# Patient Record
Sex: Female | Born: 1955 | Race: White | Marital: Married | Smoking: Former smoker
Health system: Southern US, Community
[De-identification: ages and names within clinical notes are randomized; demographics above are authoritative.]

## PROBLEM LIST (undated history)

## (undated) DIAGNOSIS — Z87820 Personal history of traumatic brain injury: Secondary | ICD-10-CM

## (undated) DIAGNOSIS — I10 Essential (primary) hypertension: Secondary | ICD-10-CM

## (undated) DIAGNOSIS — Z780 Asymptomatic menopausal state: Secondary | ICD-10-CM

## (undated) DIAGNOSIS — N8110 Cystocele, unspecified: Secondary | ICD-10-CM

## (undated) DIAGNOSIS — E785 Hyperlipidemia, unspecified: Secondary | ICD-10-CM

## (undated) DIAGNOSIS — IMO0001 Reserved for inherently not codable concepts without codable children: Secondary | ICD-10-CM

## (undated) DIAGNOSIS — K219 Gastro-esophageal reflux disease without esophagitis: Secondary | ICD-10-CM

## (undated) DIAGNOSIS — R569 Unspecified convulsions: Secondary | ICD-10-CM

## (undated) HISTORY — PX: DILATION AND CURETTAGE OF UTERUS: SHX78

## (undated) HISTORY — DX: Hyperlipidemia, unspecified: E78.5

## (undated) HISTORY — DX: Asymptomatic menopausal state: Z78.0

## (undated) HISTORY — PX: TONSILLECTOMY: SUR1361

## (undated) HISTORY — DX: Essential (primary) hypertension: I10

## (undated) HISTORY — DX: Reserved for inherently not codable concepts without codable children: IMO0001

## (undated) HISTORY — DX: Personal history of traumatic brain injury: Z87.820

---

## 1978-06-08 HISTORY — PX: TRACHEOSTOMY CLOSURE: SHX6132

## 2004-12-03 ENCOUNTER — Ambulatory Visit: Payer: Self-pay

## 2005-12-29 ENCOUNTER — Ambulatory Visit: Payer: Self-pay

## 2008-02-01 ENCOUNTER — Ambulatory Visit: Payer: Self-pay

## 2008-08-30 ENCOUNTER — Encounter: Payer: Self-pay | Admitting: Cardiovascular Disease

## 2008-09-09 LAB — HM COLONOSCOPY

## 2008-09-19 ENCOUNTER — Ambulatory Visit: Payer: Self-pay | Admitting: Gastroenterology

## 2009-05-15 ENCOUNTER — Ambulatory Visit: Payer: Self-pay | Admitting: Family Medicine

## 2009-08-27 ENCOUNTER — Ambulatory Visit: Payer: Self-pay | Admitting: Cardiovascular Disease

## 2009-08-27 DIAGNOSIS — R079 Chest pain, unspecified: Secondary | ICD-10-CM

## 2009-08-27 DIAGNOSIS — I10 Essential (primary) hypertension: Secondary | ICD-10-CM | POA: Insufficient documentation

## 2009-10-17 ENCOUNTER — Ambulatory Visit: Payer: Self-pay | Admitting: Family Medicine

## 2009-12-10 ENCOUNTER — Ambulatory Visit: Payer: Self-pay | Admitting: Family Medicine

## 2010-05-20 ENCOUNTER — Ambulatory Visit: Payer: Self-pay

## 2010-07-08 NOTE — Assessment & Plan Note (Signed)
Summary: NP6/AMD   Visit Type:  New Patient Referring Provider:  Sowles Primary Provider:  Dr Carlynn Purl  CC:  bp elevated, some chest discomfort, no sob, and no edema.  History of Present Illness: [name] is a 55 year old woman referred by Dr.Sowles, with a long history of chest pain over the past several years, with stress test last year per her report as well as an additional stress test several years ago that were reportedly normal, presents for evaluation of recent chest discomfort.  She states that over the past several years, she has had chest pain typically located on the left side sometimes radiating through to her left scapular region. She states that it lasts typically less than 5 minutes him a comes on sometimes at rest and sometimes with exertion. She is unable to reproduce it, states he does not get worse with exercise. She leads a busy lifestyle and works at [employer] and takes care of 3 teenagers. Her pain has not been getting progressively worse and has been stable over the past several years.  She denies any lightheadedness, dizziness, diaphoresis, edema, GERD symptoms. She does report she pushes on her discomfort with fingers it seems to make the pain go away both in her back and in her left chest region.  she was recently started on lisinopril HCT for hypertension in addition to clonidine. She denies any lightheadedness and dizziness. Today was the first day of the medication.  EKG from March 21 shows normal sinus rhythm with rate 58 beats per minute, no significant ST or T wave changes noted.  Preventive Screening-Counseling & Management  Alcohol-Tobacco     Alcohol drinks/day: 0     Smoking Status: quit     Year Quit: 1986  Caffeine-Diet-Exercise     Caffeine use/day: 2-3     Does Patient Exercise: yes  Current Problems (verified): 1)  Hypertension, Benign  (ICD-401.1) 2)  Chest Pain-unspecified  (ICD-786.50)  Current Medications (verified): 1)  Daily Multi   Tabs (Multiple Vitamins-Minerals) .Marland Kitchen.. 1 By Mouth Once Daily 2)  Clonidine Hcl 0.1 Mg Tabs (Clonidine Hcl) .... Take One Tablet By Mouth Once Daily 3)  Lisinopril-Hydrochlorothiazide 20-12.5 Mg Tabs (Lisinopril-Hydrochlorothiazide) .Marland Kitchen.. 1 By Mouth Once Daily  Allergies (verified): No Known Drug Allergies  Past History:  Past Medical History: Last updated: 08/27/2009 perimenopause HTN chest pains stress test inthe 2000's normal with palps  Past Surgical History: Last updated: 08/27/2009 tonsillecgtomy d&c after one of her deliveries  Family History: Last updated: 08/27/2009 Father: living: DM and gallstones Mother: living 43's: arrhythmia Sister: DM  Social History: Last updated: 08/27/2009 Full Time Married  Tobacco Use - Former.  Alcohol Use - no Regular Exercise - yes Drug Use - no  Risk Factors: Alcohol Use: 0 (08/27/2009) Caffeine Use: 2-3 (08/27/2009) Exercise: yes (08/27/2009)  Risk Factors: Smoking Status: quit (08/27/2009)  Social History: Alcohol drinks/day:  0 Caffeine use/day:  2-3  Review of Systems       The patient complains of chest pain.  The patient denies fever, vision loss, decreased hearing, hoarseness, syncope, dyspnea on exertion, peripheral edema, prolonged cough, abdominal pain, incontinence, muscle weakness, depression, and enlarged lymph nodes.    Vital Signs:  Patient profile:   55 year old female Height:      67 inches Weight:      158.50 pounds BMI:     24.91 Pulse rate:   76 / minute Pulse rhythm:   regular BP sitting:   100 / 67  (left arm)  Cuff size:   regular  Vitals Entered By: Mercer Pod (August 27, 2009 3:05 PM)  Physical Exam  General:  well-appearing middle-aged woman in no apparent distress, HEENT exam is benign, oropharynx is clear, neck is supple with no JVP or carotid bruits, heart sounds are regular with S1-S2 and no murmurs appreciated, lungs are clear to auscultation with no wheezes Rales, normal  exam is benign, no significant lower extremity edema, neurologic exam is nonfocal, skin is warm and dry.    Impression & Recommendations:  Problem # 1:  CHEST PAIN-UNSPECIFIED (ICD-786.50) etiology of her chest pain is uncertain though given the recent negative stress test at [facility], given the presentation of her symptoms which has been chronic over several years, not associated typically with exertion, very atypical in presentation, I do not think this is cardiac.  If her pains started to get worse with more typical symptoms, we could perform a repeat stress test. She is not interested in that at this time. She would not qualify for cardiac catheterization.  Medications such as nitroglycerin would not help her as her symptoms typically resolve after several minutes.  She certainly could have other causes of pain  such as coronary spasm or esophageal spasm, hiatal hernia. One option would be to change one of her blood pressure medications, such as her clonidine and/or  lisinopril to a calcium channel blocker such as verapamil or diltiazem.  I have asked her to try an exercise program to ensure that this is not muscular ligamental in etiology. There seemed to be some sensitivity with palpation both in her back and in the left chest region.  We will try to obtain her most recent cholesterol panel for our records. There is a family history of coronary artery disease with her mother having bypass surgery. Have asked her to continue the aspirin and once we have a cholesterol, we can make further recommendation.  Her updated medication list for this problem includes:    Lisinopril-hydrochlorothiazide 20-12.5 Mg Tabs (Lisinopril-hydrochlorothiazide) .Marland Kitchen... 1 by mouth once daily  Problem # 2:  HYPERTENSION, BENIGN (ICD-401.1) she started lisinopril today and blood pressure was borderline low. I've asked her to purchase a blood pressure cuff and check her pressures at home to  ensure that she is within a good range. The asked her to contact Dr. Carlynn Purl if her medications need adjusting. We would be happy to help if needed.  Her updated medication list for this problem includes:    Clonidine Hcl 0.1 Mg Tabs (Clonidine hcl) .Marland Kitchen... Take one tablet by mouth once daily    Lisinopril-hydrochlorothiazide 20-12.5 Mg Tabs (Lisinopril-hydrochlorothiazide) .Marland Kitchen... 1 by mouth once daily

## 2010-07-08 NOTE — Progress Notes (Signed)
Summary: PHI  PHI   Imported By: Harlon Flor 08/28/2009 10:33:06  _____________________________________________________________________  External Attachment:    Type:   Image     Comment:   External Document

## 2010-12-23 ENCOUNTER — Encounter: Payer: Self-pay | Admitting: Cardiovascular Disease

## 2011-07-15 ENCOUNTER — Ambulatory Visit: Payer: Self-pay

## 2012-04-17 ENCOUNTER — Emergency Department: Payer: Self-pay | Admitting: Emergency Medicine

## 2012-08-02 ENCOUNTER — Ambulatory Visit: Payer: Self-pay

## 2013-08-07 ENCOUNTER — Ambulatory Visit: Payer: Self-pay

## 2013-08-16 ENCOUNTER — Ambulatory Visit: Payer: Self-pay | Admitting: Unknown Physician Specialty

## 2017-11-08 ENCOUNTER — Encounter: Payer: Self-pay | Admitting: Urology

## 2017-11-08 ENCOUNTER — Ambulatory Visit: Payer: Self-pay | Admitting: Urology

## 2017-11-08 VITALS — BP 160/82 | HR 56 | Ht 67.0 in | Wt 156.3 lb

## 2017-11-08 DIAGNOSIS — N939 Abnormal uterine and vaginal bleeding, unspecified: Secondary | ICD-10-CM

## 2017-11-08 DIAGNOSIS — R109 Unspecified abdominal pain: Secondary | ICD-10-CM

## 2017-11-08 DIAGNOSIS — N3020 Other chronic cystitis without hematuria: Secondary | ICD-10-CM

## 2017-11-08 LAB — MICROSCOPIC EXAMINATION
RBC, UA: NONE SEEN /hpf (ref 0–2)
WBC, UA: NONE SEEN /hpf (ref 0–5)

## 2017-11-08 LAB — URINALYSIS, COMPLETE
BILIRUBIN UA: NEGATIVE
GLUCOSE, UA: NEGATIVE
Ketones, UA: NEGATIVE
Nitrite, UA: NEGATIVE
PROTEIN UA: NEGATIVE
RBC UA: NEGATIVE
Specific Gravity, UA: 1.01 (ref 1.005–1.030)
UUROB: 0.2 mg/dL (ref 0.2–1.0)
pH, UA: 6.5 (ref 5.0–7.5)

## 2017-11-08 MED ORDER — ME/NAPHOS/MB/HYO1 81.6 MG PO TABS: 1.0000 | ORAL_TABLET | Freq: Two times a day (BID) | ORAL | 3 refills | Status: DC | PRN

## 2017-11-08 NOTE — Progress Notes (Signed)
11/08/2017 4:08 PM   Wallace Keller 1955-12-26 [id number]  Referring provider: No referring provider defined for this encounter.  Chief Complaint  Patient presents with  . New Patient (Initial Visit)    HPI: For many months the patient has had suprapubic soreness.  Is not quite daily.  It comes and goes.  Sometimes it might be relieved by voiding.  She normally voids every 2 hours and now gets up once and sometimes twice at night.  She wears a liner for confidence and rarely leaks  She has had a hysterectomy.  Bowel movements normal.  No neurologic issues.  No previous GU surgery.  She has not had a kidney stone  Modifying factors: There are no other modifying factors  Associated signs and symptoms: There are no other associated signs and symptoms Aggravating and relieving factors: There are no other aggravating or relieving factors Severity: Moderate Duration: Persistent   PMH: Past Medical History:  Diagnosis Date  . Chest pain   . HTN (hypertension)   . Normal cardiac stress test 2000s   normal with palps  . Perimenopause     Surgical History: Past Surgical History:  Procedure Laterality Date  . DILATION AND CURETTAGE OF UTERUS     after one of her deliveries   . TONSILLECTOMY      Home Medications:  Allergies as of 11/08/2017      Reactions   Dilantin [phenytoin Sodium Extended] Rash      Medication List        Accurate as of 11/08/17  4:08 PM. Always use your most recent med list.          aspirin 325 MG EC tablet Take 325 mg by mouth daily.       Allergies:  Allergies  Allergen Reactions  . Dilantin [Phenytoin Sodium Extended] Rash    Family History: Family History  Problem Relation Age of Onset  . Arrhythmia Mother   . Diabetes Father   . Cholelithiasis Father   . Diabetes Sister   . Bladder Cancer Neg Hx   . Kidney cancer Neg Hx     Social History:  reports that she has quit smoking. She has never used smokeless tobacco. She  reports that she does not drink alcohol or use drugs.  ROS: UROLOGY Frequent Urination?: Yes Hard to postpone urination?: Yes Burning/pain with urination?: No Get up at night to urinate?: Yes Leakage of urine?: No Urine stream starts and stops?: No Trouble starting stream?: No Do you have to strain to urinate?: No Blood in urine?: No Urinary tract infection?: No Sexually transmitted disease?: No Injury to kidneys or bladder?: No Painful intercourse?: No Weak stream?: Yes Currently pregnant?: No Vaginal bleeding?: No Last menstrual period?: n  Gastrointestinal Nausea?: No Vomiting?: No Indigestion/heartburn?: No Diarrhea?: No Constipation?: No  Constitutional Fever: No Night sweats?: No Weight loss?: No Fatigue?: No  Skin Skin rash/lesions?: No Itching?: No  Eyes Blurred vision?: No Double vision?: No  Ears/Nose/Throat Sore throat?: No Sinus problems?: No  Hematologic/Lymphatic Swollen glands?: No Easy bruising?: No  Cardiovascular Leg swelling?: No Chest pain?: No  Respiratory Cough?: No Shortness of breath?: No  Endocrine Excessive thirst?: No  Musculoskeletal Back pain?: No Joint pain?: No  Neurological Headaches?: No Dizziness?: No  Psychologic Depression?: No Anxiety?: No  Physical Exam: BP (!) 160/82 (BP Location: Right Arm, Patient Position: Sitting, Cuff Size: Normal)   Pulse (!) 56   Ht 5\' 7"  (1.702 m)   Wt 70.9 kg (  156 lb 4.8 oz)   BMI 24.48 kg/m   Constitutional:  Alert and oriented, No acute distress. HEENT: [place] AT, moist mucus membranes.  Trachea midline, no masses. Cardiovascular: No clubbing, cyanosis, or edema. Respiratory: Normal respiratory effort, no increased work of breathing. GI: Abdomen is soft, nontender, nondistended, no abdominal masses GU: On pelvic examination the patient had a grade 3 cystocele that reached to the introitus.  She had a moderate central defect.  Her cervix descended from 8 or 9 cm to  approximately 5 or 6 cm.  She had a little to no rectocele.  She may have leak a few drops of the cystocele reduced with coughing with hypermobility of the bladder neck.  Tissues were well estrogenized.  I noted a little bit of blood on the tip of the speculum.  Her mid abdomen was minimally tender Skin: No rashes, bruises or suspicious lesions. Lymph: No cervical or inguinal adenopathy. Neurologic: Grossly intact, no focal deficits, moving all 4 extremities. Psychiatric: Normal mood and affect.  Laboratory Data:  Urinalysis No results found for: COLORURINE, APPEARANCEUR, LABSPEC, PHURINE, GLUCOSEU, HGBUR, BILIRUBINUR, KETONESUR, PROTEINUR, UROBILINOGEN, NITRITE, LEUKOCYTESUR  Pertinent Imaging: None  Assessment & Plan: The patient has vague suprapubic pain.  The role of a CT scan and cystoscopy discussed.  At this stage I do not think she has interstitial cystitis.  Her pain seems to be more mid abdomen and she said it was associated with some nausea today.  At first she did not report prolapse symptoms but then reported with standing she feels a little bulging or pressure in the vagina.  She understands a source of the blood and her pain may be her uterus and she still has her ovaries.  I would like to order the above tests and refer her to gynecology.  I do not believe the prolapse is a cause of her symptoms and I am a little bit concerned about the central nature of her pain associated with nausea.  She had some vaginal discomfort after examination in my opinion out of the ordinary but she does not report a lot of vaginal dryness.  I will be extra careful with cystoscopy next visit  There are no diagnoses linked to this encounter.  No follow-ups on file.  Martina SinnerMACDIARMID,Ajiah Mcglinn A, MD  [facility] [address], [address] [place], [place] [address] [phone]

## 2017-11-11 LAB — CULTURE, URINE COMPREHENSIVE

## 2017-11-29 ENCOUNTER — Ambulatory Visit
Admission: RE | Admit: 2017-11-29 | Discharge: 2017-11-29 | Disposition: A | Discharge status: Home / Self Care | Payer: BLUE CROSS/BLUE SHIELD | Source: Ambulatory Visit | Attending: Urology | Admitting: Urology

## 2017-11-29 DIAGNOSIS — N8110 Cystocele, unspecified: Secondary | ICD-10-CM | POA: Insufficient documentation

## 2017-11-29 DIAGNOSIS — N8189 Other female genital prolapse: Secondary | ICD-10-CM | POA: Insufficient documentation

## 2017-11-29 DIAGNOSIS — N939 Abnormal uterine and vaginal bleeding, unspecified: Secondary | ICD-10-CM | POA: Diagnosis present

## 2017-11-29 LAB — POCT I-STAT CREATININE: Creatinine, Ser: 0.7 mg/dL (ref 0.44–1.00)

## 2017-11-29 MED ORDER — IOPAMIDOL (ISOVUE-300) INJECTION 61%
100.0000 mL | Freq: Once | INTRAVENOUS | Status: AC | PRN
  Administered 2017-11-29: 100 mL via INTRAVENOUS

## 2017-12-06 ENCOUNTER — Ambulatory Visit (INDEPENDENT_AMBULATORY_CARE_PROVIDER_SITE_OTHER): Payer: BLUE CROSS/BLUE SHIELD | Admitting: Urology

## 2017-12-06 ENCOUNTER — Encounter: Payer: Self-pay | Admitting: Urology

## 2017-12-06 VITALS — BP 154/85 | HR 60 | Wt 157.8 lb

## 2017-12-06 DIAGNOSIS — N3020 Other chronic cystitis without hematuria: Secondary | ICD-10-CM | POA: Diagnosis not present

## 2017-12-06 LAB — URINALYSIS, COMPLETE
Bilirubin, UA: NEGATIVE
Glucose, UA: NEGATIVE
Ketones, UA: NEGATIVE
Nitrite, UA: NEGATIVE
Protein, UA: NEGATIVE
RBC, UA: NEGATIVE
Specific Gravity, UA: <1.005 — ABNORMAL LOW (ref 1.005–1.030)
Urobilinogen, Ur: 0.2 mg/dL (ref 0.2–1.0)
pH, UA: 5.5 (ref 5.0–7.5)

## 2017-12-06 LAB — MICROSCOPIC EXAMINATION: RBC, UA: NONE SEEN /hpf (ref 0–2)

## 2017-12-06 MED ORDER — CIPROFLOXACIN HCL 500 MG PO TABS
500.0000 mg | ORAL_TABLET | Freq: Once | ORAL | Status: AC
  Administered 2017-12-06: 500 mg via ORAL

## 2017-12-06 MED ORDER — LIDOCAINE HCL URETHRAL/MUCOSAL 2 % EX GEL
1.0000 "application " | Freq: Once | CUTANEOUS | Status: AC
  Administered 2017-12-06: 1 via URETHRAL

## 2017-12-06 NOTE — Progress Notes (Addendum)
12/06/2017 3:40 PM   Tonya Murray 12-30-1955 [id number]  Referring provider: No referring provider defined for this encounter.  Chief Complaint  Patient presents with  . Cysto    HPI: For many months the patient has had suprapubic soreness.  Is not quite daily.  It comes and goes.  Sometimes it might be relieved by voiding.  She normally voids every 2 hours and now gets up once and sometimes twice at night.  She wears a liner for confidence and rarely leaks  On pelvic examination the patient had a grade 3 cystocele that reached to the introitus.  She had a moderate central defect.  Her cervix descended from 8 or 9 cm to approximately 5 or 6 cm.  She had a little to no rectocele.  She may have leak a few drops of the cystocele reduced with coughing with hypermobility of the bladder neck.  Tissues were well estrogenized.  I noted a little bit of blood on the tip of the speculum.  Her mid abdomen was minimally tender  The patient has vague suprapubic pain.  The role of a CT scan and cystoscopy discussed.  At this stage I do not think she has interstitial cystitis.  Her pain seems to be more mid abdomen and she said it was associated with some nausea today.  At first she did not report prolapse symptoms but then reported with standing she feels a little bulging or pressure in the vagina.  She understands a source of the blood and her pain may be her uterus and she still has her ovaries.  I would like to order the above tests and refer her to gynecology.  I do not believe the prolapse is a cause of her symptoms and I am a little bit concerned about the central nature of her pain associated with nausea.  She had some vaginal discomfort after examination in my opinion out of the ordinary but she does not report a lot of vaginal dryness.  I will be extra careful with cystoscopy next visit  Today Frequency is stable.  CT scan was normal.  Urine culture was negative.  Urinalysis from June 3 negative  for blood  To clarify the patient has not been having blood vaginally or in the urine.  Her abdominal pain has gone.  She gives vague symptoms of sometimes she feels like something is shifting in her abdomen.  The nausea is gone  She still has a significant cystocele just reaching the introitus.  Cystoscopy utilizing sterile technique after consent the patient underwent flexible cystoscopy.  Bladder mucosa and trigone were normal.  She had a moderate cystocele.  There were no trigonal abnormalities.  There is no cystitis.  He tolerated procedure well   PMH: Past Medical History:  Diagnosis Date  . Chest pain   . HTN (hypertension)   . Normal cardiac stress test 2000s   normal with palps  . Perimenopause     Surgical History: Past Surgical History:  Procedure Laterality Date  . DILATION AND CURETTAGE OF UTERUS     after one of her deliveries   . TONSILLECTOMY      Home Medications:  Allergies as of 12/06/2017      Reactions   Dilantin [phenytoin Sodium Extended] Rash      Medication List        Accurate as of 12/06/17  3:40 PM. Always use your most recent med list.          aspirin  325 MG EC tablet Take 325 mg by mouth daily.       Allergies:  Allergies  Allergen Reactions  . Dilantin [Phenytoin Sodium Extended] Rash    Family History: Family History  Problem Relation Age of Onset  . Arrhythmia Mother   . Diabetes Father   . Cholelithiasis Father   . Diabetes Sister   . Bladder Cancer Neg Hx   . Kidney cancer Neg Hx     Social History:  reports that she has quit smoking. She has never used smokeless tobacco. She reports that she does not drink alcohol or use drugs.  ROS:                                        Physical Exam: BP (!) 154/85   Pulse 60   Wt 71.6 kg (157 lb 12.8 oz)   BMI 24.71 kg/m   Constitutional:  Alert and oriented, No acute distress.  Laboratory Data: No results found for: WBC, HGB, HCT, MCV, PLT  Lab  Results  Component Value Date   CREATININE 0.70 11/29/2017    No results found for: PSA  No results found for: TESTOSTERONE  No results found for: HGBA1C  Urinalysis    Component Value Date/Time   APPEARANCEUR Clear 11/08/2017 1600   GLUCOSEU Negative 11/08/2017 1600   BILIRUBINUR Negative 11/08/2017 1600   PROTEINUR Negative 11/08/2017 1600   NITRITE Negative 11/08/2017 1600   LEUKOCYTESUR Trace (A) 11/08/2017 1600    Pertinent Imaging:   Assessment & Plan: Patient has been cleared for vague abdominal complaints.  She has minimally symptomatic significant prolapse The patient and I had somewhat of a circular conversation regarding her cystocele.  2 but likely 3 times I explained to her that if the vaginal symptom was significant enough it would likely require a hysterectomy and reconstructive vaginal surgery likely a vault suspension cystocele repair and graft.  I did not mention urodynamics.  I had to keep clarifying that is not the cause of her vague complaints in the abdomen that shift.  I did mention a pessary and a course watchful waiting.  I can always reassess her in the future and I will see her as needed.  I think she was confusing her vague complaints with severity of prolapse symptoms.  I was surprised somewhat when the staff said the patient did not understand or feel that we had discussed things.  I brought her back in her room and walked her through it again.  I was polite and mention to her that this is third but likely the fourth time that I walked her through this.  She chose watchful waiting again.  She understands that if she gets a local pessary which is a good idea perhaps that if she eventually needed prolapse surgery by myself I would have [facility] where I do prolapse repairs.  I did walk her through urodynamics as well.  1. Chronic cystitis  - Urinalysis, Complete - lidocaine (XYLOCAINE) 2 % jelly 1 application -  ciprofloxacin (CIPRO) tablet 500 mg   Return if symptoms worsen or fail to improve.  Martina SinnerMACDIARMID,Talene Glastetter A, MD  [facility] [address], [address] [place], [place] [address] [phone]

## 2018-04-06 ENCOUNTER — Telehealth: Payer: Self-pay

## 2018-04-06 NOTE — Telephone Encounter (Signed)
Called and left voice mail for patient to call back to be schedule °

## 2018-04-06 NOTE — Telephone Encounter (Signed)
Please call and schedule pt with [facility] for bladder prolapse

## 2018-04-25 ENCOUNTER — Encounter: Payer: Self-pay | Admitting: Obstetrics & Gynecology

## 2018-04-29 ENCOUNTER — Other Ambulatory Visit (HOSPITAL_COMMUNITY)
Admission: RE | Admit: 2018-04-29 | Discharge: 2018-04-29 | Disposition: A | Discharge status: Home / Self Care | Payer: BLUE CROSS/BLUE SHIELD | Source: Ambulatory Visit | Attending: Obstetrics & Gynecology | Admitting: Obstetrics & Gynecology

## 2018-04-29 ENCOUNTER — Ambulatory Visit (INDEPENDENT_AMBULATORY_CARE_PROVIDER_SITE_OTHER): Payer: BLUE CROSS/BLUE SHIELD | Admitting: Obstetrics & Gynecology

## 2018-04-29 ENCOUNTER — Telehealth: Payer: Self-pay | Admitting: Obstetrics & Gynecology

## 2018-04-29 ENCOUNTER — Encounter: Payer: Self-pay | Admitting: Obstetrics & Gynecology

## 2018-04-29 VITALS — BP 150/80 | Ht 68.0 in | Wt 158.0 lb

## 2018-04-29 DIAGNOSIS — Z124 Encounter for screening for malignant neoplasm of cervix: Secondary | ICD-10-CM | POA: Insufficient documentation

## 2018-04-29 DIAGNOSIS — N812 Incomplete uterovaginal prolapse: Secondary | ICD-10-CM

## 2018-04-29 DIAGNOSIS — Z1239 Encounter for other screening for malignant neoplasm of breast: Secondary | ICD-10-CM | POA: Diagnosis not present

## 2018-04-29 NOTE — Progress Notes (Signed)
Cystocele/Rectocele Concerns Patient is a new G65P3 WF who presents w complains of a vaginal uncomfortable bulge that is bothersome and interferes w work and other activities.  SHe is still sexually active at rare occasions.  SHe has no bleeding and has passed thru menopause.  No discharge, uregncy, frrquency, nocturia, or leakage of urine.. Problem started several months ago. Symptoms include: prolapse of tissue with straining and discomfort: moderate. Symptoms have gradually worsened.  She has h/o NSVD x3 years ago.  Her career has not involved heavy lifting.    Pt has no PCP and has not seen MD for a while.  Last PAP years ago.  No recent MMG or colonoscopy.  No flu shot or other immunizations of recent note.  Pt denies other health concerns at this time.  PMHx: She  has a past medical history of Chest pain, HTN (hypertension), Normal cardiac stress test (2000s), and Perimenopause. Also,  has a past surgical history that includes Tonsillectomy and Dilation and curettage of uterus., family history includes Arrhythmia in her mother; Cholelithiasis in her father; Diabetes in her father and sister.,  reports that she has quit smoking. She has never used smokeless tobacco. She reports that she does not drink alcohol or use drugs.  She has a current medication list which includes the following prescription(s): aspirin. Also, is allergic to dilantin [phenytoin sodium extended].  Review of Systems  Constitutional: Negative for chills, fever and malaise/fatigue.  HENT: Negative for congestion, sinus pain and sore throat.   Eyes: Negative for blurred vision and pain.  Respiratory: Negative for cough and wheezing.   Cardiovascular: Negative for chest pain and leg swelling.  Gastrointestinal: Negative for abdominal pain, constipation, diarrhea, heartburn, nausea and vomiting.  Genitourinary: Negative for dysuria, frequency, hematuria and urgency.  Musculoskeletal: Negative for back pain, joint pain,  myalgias and neck pain.  Skin: Negative for itching and rash.  Neurological: Negative for dizziness, tremors and weakness.  Endo/Heme/Allergies: Does not bruise/bleed easily.  Psychiatric/Behavioral: Negative for depression. The patient is not nervous/anxious and does not have insomnia.    Objective: BP (!) 150/80   Ht 5\' 8"  (1.727 m)   Wt 158 lb (71.7 kg)   BMI 24.02 kg/m  Physical Exam  Constitutional: She is oriented to person, place, and time. She appears well-developed and well-nourished. No distress.  Genitourinary: Rectum normal, vagina normal and uterus normal. Pelvic exam was performed with patient supine. There is no rash or lesion on the right labia. There is no rash or lesion on the left labia. Vagina exhibits no lesion. No bleeding in the vagina. Right adnexum does not display mass and does not display tenderness. Left adnexum does not display mass and does not display tenderness. Cervix does not exhibit motion tenderness, lesion, friability or polyp.   Uterus is mobile and midaxial. Uterus is not enlarged or exhibiting a mass.  Genitourinary Comments: Cystocele Gr 3, Uterine prolapse Gr3, Rectocele Gr 1 Min atrophy No cervical lesions, small uterus, no adnexal mass  HENT:  Head: Normocephalic and atraumatic. Head is without laceration.  Right Ear: Hearing normal.  Left Ear: Hearing normal.  Nose: No epistaxis.  No foreign bodies.  Mouth/Throat: Uvula is midline, oropharynx is clear and moist and mucous membranes are normal.  Eyes: Pupils are equal, round, and reactive to light.  Neck: Normal range of motion. Neck supple. No thyromegaly present.  Cardiovascular: Normal rate and regular rhythm. Exam reveals no gallop and no friction rub.  No murmur heard. Pulmonary/Chest: Effort normal  and breath sounds normal. No respiratory distress. She has no wheezes. Right breast exhibits no mass, no skin change and no tenderness. Left breast exhibits no mass, no skin change and no  tenderness.  Abdominal: Soft. Bowel sounds are normal. She exhibits no distension. There is no tenderness. There is no rebound.  Musculoskeletal: Normal range of motion.  Neurological: She is alert and oriented to person, place, and time. No cranial nerve deficit.  Skin: Skin is warm and dry.  Psychiatric: She has a normal mood and affect. Judgment normal.  Vitals reviewed.  ASSESSMENT/PLAN:   Visit Diagnoses    Screening for cervical cancer    -  Primary   Relevant Orders   Cytology - PAP   Cystocele with incomplete uterovaginal prolapse        Options d/w pt for pessary vs hysterectomy/repair options   Pros and cons of each discussed in detail   Pt prefers TVH, BSO, AR option      Info provided.  Sch for Dec 19, but will discuss more at a follow up visit Pt needs PCP and a list of practices in the area given to pt.    PAP done today    MMG ordered and encouraged  A total of more than 60 minutes were spent face-to-face with the patient during this encounter and over half of that time dealt with counseling and coordination of care.  Annamarie MajorPaul Gaige Fussner, MD, Merlinda FrederickFACOG [facility], [facility] 04/29/2018  2:08 PM

## 2018-04-29 NOTE — Telephone Encounter (Signed)
-----   Message from Nadara Mustardobert P Harris, MD sent at 04/29/2018  2:35 PM EST ----- Regarding: SURGERY [facility] surgery DEC 19, needs preop, she does not have PCP and so if needs clearance we need to know pronto  Surgery Booking Request Patient Full Name:  Tonya Murray  MRN: [id number]  DOB: 24-Sep-1955  Surgeon: Letitia Libraobert Paul Harris, MD  Requested Surgery Date and Time: 05/26/18 Primary Diagnosis AND Code: Cystocele and uterine prolapse Secondary Diagnosis and Code:  Surgical Procedure: TVH, BSO, AP Repair L&D Notification: No Admission Status: same day surgery Length of Surgery: 1.5 h Special Case Needs: no H&P: yes (date) Phone Interview???: no Interpreter: Language:  Medical Clearance: no Special Scheduling Instructions: no

## 2018-04-29 NOTE — Patient Instructions (Addendum)
Pelvic Organ Prolapse Pelvic organ prolapse is the stretching, bulging, or dropping of pelvic organs into an abnormal position. It happens when the muscles and tissues that surround and support pelvic structures are stretched or weak. Pelvic organ prolapse can involve:  Vagina (vaginal prolapse).  Uterus (uterine prolapse).  Bladder (cystocele).  Rectum (rectocele).  Intestines (enterocele).  When organs other than the vagina are involved, they often bulge into the vagina or protrude from the vagina, depending on how severe the prolapse is. What are the causes? Causes of this condition include:  Pregnancy, labor, and childbirth.  Long-lasting (chronic) cough.  Chronic constipation.  Obesity.  Past pelvic surgery.  Aging. During and after menopause, a decreased production of the hormone estrogen can weaken pelvic ligaments and muscles.  Consistently lifting more than 50 lb (23 kg).  Buildup of fluid in the abdomen due to certain diseases and other conditions.  What are the signs or symptoms? Symptoms of this condition include:  Loss of bladder control when you cough, sneeze, strain, and exercise (stress incontinence). This may be worse immediately following childbirth, and it may gradually improve over time.  Feeling pressure in your pelvis or vagina. This pressure may increase when you cough or when you are having a bowel movement.  A bulge that protrudes from the opening of your vagina or against your vaginal wall. If your uterus protrudes through the opening of your vagina and rubs against your clothing, you may also experience soreness, ulcers, infection, pain, and bleeding.  Increased effort to have a bowel movement or urinate.  Pain in your low back.  Pain, discomfort, or disinterest in sexual intercourse.  Repeated bladder infections (urinary tract infections).  Difficulty inserting or inability to insert a tampon or applicator.  In some people, this  condition does not cause any symptoms. How is this diagnosed? Your health care provider may perform an internal and external vaginal and rectal exam. During the exam, you may be asked to cough and strain while you are lying down, sitting, and standing up. Your health care provider will determine if other tests are required, such as bladder function tests. How is this treated? In most cases, this condition needs to be treated only if it produces symptoms. No treatment is guaranteed to correct the prolapse or relieve the symptoms completely. Treatment may include:  Lifestyle changes, such as: ? Avoiding drinking beverages that contain caffeine. ? Increasing your intake of high-fiber foods. This can help to decrease constipation and straining during bowel movements. ? Emptying your bladder at scheduled times (bladder training therapy). This can help to reduce or avoid urinary incontinence. ? Losing weight if you are overweight or obese.  Estrogen. Estrogen may help mild prolapse by increasing the strength and tone of pelvic floor muscles.  Kegel exercises. These may help mild cases of prolapse by strengthening and tightening the muscles of the pelvic floor.  Pessary insertion. A pessary is a soft, flexible device that is placed into your vagina by your health care provider to help support the vaginal walls and keep pelvic organs in place.  Surgery. This is often the only form of treatment for severe prolapse. Different types of surgeries are available.  Follow these instructions at home:  Wear a sanitary pad or absorbent product if you have urinary incontinence.  Avoid heavy lifting and straining with exercise and work. Do not hold your breath when you perform mild to moderate lifting and exercise activities. Limit your activities as directed by your health care   provider.  Take medicines only as directed by your health care provider.  Perform Kegel exercises as directed by your health care  provider.  If you have a pessary, take care of it as directed by your health care provider. Contact a health care provider if:  Your symptoms interfere with your daily activities or sex life.  You need medicine to help with the discomfort.  You notice bleeding from the vagina that is not related to your period.  You have a fever.  You have pain or bleeding when you urinate.  You have bleeding when you have a bowel movement.  You lose urine when you have sex.  You have chronic constipation.  You have a pessary that falls out.  You have vaginal discharge that has a bad smell.  You have low abdominal pain or cramping that is unusual for you.   Vaginal Hysterectomy A vaginal hysterectomy is a procedure to remove all or part of the uterus through a small incision in the vagina. In this procedure, your health care provider may remove your entire uterus, including the lower end (cervix). You may need a vaginal hysterectomy to treat:  Uterine fibroids.  A condition that causes the lining of the uterus to grow in other areas (endometriosis).  Problems with pelvic support.  Cancer of the cervix, ovaries, uterus, or tissue that lines the uterus (endometrium).  Excessive (dysfunctional) uterine bleeding.  When removing your uterus, your health care provider may also remove the organs that produce eggs (ovaries) and the tubes that carry eggs to your uterus (fallopian tubes). After a vaginal hysterectomy, you will no longer be able to have a baby. You will also no longer get your menstrual period. Tell a health care provider about:  Any allergies you have.  All medicines you are taking, including vitamins, herbs, eye drops, creams, and over-the-counter medicines.  Any problems you or family members have had with anesthetic medicines.  Any blood disorders you have.  Any surgeries you have had.  Any medical conditions you have.  Whether you are pregnant or may be  pregnant. What are the risks? Generally, this is a safe procedure. However, problems may occur, including:  Bleeding.  Infection.  A blood clot that forms in your leg and travels to your lungs (pulmonary embolism).  Damage to surrounding organs.  Pain during sex.  What happens before the procedure?  Ask your health care provider what organs will be removed during surgery.  Ask your health care provider about: ? Changing or stopping your regular medicines. This is especially important if you are taking diabetes medicines or blood thinners. ? Taking medicines such as aspirin and ibuprofen. These medicines can thin your blood. Do not take these medicines before your procedure if your health care provider instructs you not to.  Follow instructions from your health care provider about eating or drinking restrictions.  Do not use any tobacco products, such as cigarettes, chewing tobacco, and e-cigarettes. If you need help quitting, ask your health care provider.  Plan to have someone take you home after discharge from the hospital. What happens during the procedure?  To reduce your risk of infection: ? Your health care team will wash or sanitize their hands. ? Your skin will be washed with soap.  An IV tube will be inserted into one of your veins.  You may be given antibiotic medicine to help prevent infection.  You will be given one or more of the following: ? A medicine to  help you relax (sedative). ? A medicine to numb the area (local anesthetic). ? A medicine to make you fall asleep (general anesthetic). ? A medicine that is injected into an area of your body to numb everything beyond the injection site (regional anesthetic).  Your surgeon will make an incision in your vagina.  Your surgeon will locate and remove all or part of your uterus.  Your ovaries and fallopian tubes may be removed at the same time.  The incision will be closed with stitches (sutures) that  dissolve over time. The procedure may vary among health care providers and hospitals. What happens after the procedure?  Your blood pressure, heart rate, breathing rate, and blood oxygen level will be monitored often until the medicines you were given have worn off.  You will be encouraged to get up and walk around after a few hours to help prevent complications.  You may have IV tubes in place for a few days.  You will be given pain medicine as needed.  Do not drive for 24 hours if you were given a sedative. This information is not intended to replace advice given to you by your health care provider. Make sure you discuss any questions you have with your health care provider. Document Released: 09/16/2015 Document Revised: 10/31/2015 Document Reviewed: 06/09/2015 Elsevier Interactive Patient Education  2018 [employer].  Primary Care in the area  This is not meant to be comprehensive list, but a resource for some providers that you can call for counseling or medication needs. If you have a recommendation, please let us know.   [facility]:  [phone]               Dr Julieanne Manson               Dr Jamse Belfast               Dr [facility]:  [phone] Dr Baruch Gouty      Dr Alba Cory     CALL [phone] to schedule MAMMOGRAM at [facility] at [facility] in [place]

## 2018-04-29 NOTE — Telephone Encounter (Signed)
Lmtrc

## 2018-05-02 LAB — CYTOLOGY - PAP: Diagnosis: NEGATIVE

## 2018-05-02 NOTE — Telephone Encounter (Signed)
Patient is aware of H&P at [facility] on Friday, 05/13/18 2 9:20am w/ Dr Kenton Kingfisher, Pre-admit Testing afterwards, and OR on 05/26/18. Patient is aware she may receive calls from the [place] and [facility]. Patient confirmed BCBS and no secondary insurance. Out-of-pocket met. Patient asked to change to another day after Christmas but before the end of the year, but is aware Dr Kenton Kingfisher will not be in the OR, and decided to keep 05/26/18. Patient still needs to check with her employer (busy season) and is aware she can call back if she needs to reschedule for January. Ext given.

## 2018-05-11 ENCOUNTER — Ambulatory Visit (INDEPENDENT_AMBULATORY_CARE_PROVIDER_SITE_OTHER): Payer: BLUE CROSS/BLUE SHIELD | Admitting: Family Medicine

## 2018-05-11 ENCOUNTER — Encounter: Payer: Self-pay | Admitting: Family Medicine

## 2018-05-11 VITALS — BP 160/88 | HR 68 | Temp 98.0°F | Ht 66.0 in | Wt 155.6 lb

## 2018-05-11 DIAGNOSIS — R29810 Facial weakness: Secondary | ICD-10-CM

## 2018-05-11 DIAGNOSIS — I10 Essential (primary) hypertension: Secondary | ICD-10-CM | POA: Diagnosis not present

## 2018-05-11 DIAGNOSIS — Z1211 Encounter for screening for malignant neoplasm of colon: Secondary | ICD-10-CM

## 2018-05-11 DIAGNOSIS — Z823 Family history of stroke: Secondary | ICD-10-CM | POA: Insufficient documentation

## 2018-05-11 DIAGNOSIS — Z23 Encounter for immunization: Secondary | ICD-10-CM

## 2018-05-11 DIAGNOSIS — H519 Unspecified disorder of binocular movement: Secondary | ICD-10-CM

## 2018-05-11 DIAGNOSIS — Z820 Family history of epilepsy and other diseases of the nervous system: Secondary | ICD-10-CM | POA: Insufficient documentation

## 2018-05-11 NOTE — Patient Instructions (Addendum)
Try to follow the DASH guidelines (DASH stands for Dietary Approaches to Stop Hypertension). Try to limit the sodium in your diet to no more than 1,500mg  of sodium per day. Certainly try to not exceed 2,000 mg per day at the very most. Do not add salt when cooking or at the table.  Check the sodium amount on labels when shopping, and choose items lower in sodium when given a choice. Avoid or limit foods that already contain a lot of sodium. Eat a diet rich in fruits and vegetables and whole grains, and try to lose weight if overweight or obese  Try to follow the guidelines and return in two weeks to see the CMA for a blood pressure check  Try to use PLAIN allergy or cold medicine without the decongestant if needed Avoid: phenylephrine, phenylpropanolamine, and pseudoephredine  If you need something for aches or pains, try to use Tylenol (acetaminophen) instead of non-steroidals (which include Aleve, ibuprofen, Advil, Motrin, and naproxen); non-steroidals can cause long-term kidney damage and raise blood pressure  I do recommend yearly flu shots; for individuals who don't want flu shots, try to practice excellent hand hygiene, and avoid nursing homes, day cares, and hospitals during peak flu season; taking additional vitamin C daily during flu/cold season may help boost your immune system too   DASH Eating Plan DASH stands for "Dietary Approaches to Stop Hypertension." The DASH eating plan is a healthy eating plan that has been shown to reduce high blood pressure (hypertension). It may also reduce your risk for type 2 diabetes, heart disease, and stroke. The DASH eating plan may also help with weight loss. What are tips for following this plan? General guidelines  Avoid eating more than 2,300 mg (milligrams) of salt (sodium) a day. If you have hypertension, you may need to reduce your sodium intake to 1,500 mg a day.  Limit alcohol intake to no more than 1 drink a day for nonpregnant women and 2  drinks a day for men. One drink equals 12 oz of beer, 5 oz of wine, or 1 oz of hard liquor.  Work with your health care provider to maintain a healthy body weight or to lose weight. Ask what an ideal weight is for you.  Get at least 30 minutes of exercise that causes your heart to beat faster (aerobic exercise) most days of the week. Activities may include walking, swimming, or biking.  Work with your health care provider or diet and nutrition specialist (dietitian) to adjust your eating plan to your individual calorie needs. Reading food labels  Check food labels for the amount of sodium per serving. Choose foods with less than 5 percent of the Daily Value of sodium. Generally, foods with less than 300 mg of sodium per serving fit into this eating plan.  To find whole grains, look for the word "whole" as the first word in the ingredient list. Shopping  Buy products labeled as "low-sodium" or "no salt added."  Buy fresh foods. Avoid canned foods and premade or frozen meals. Cooking  Avoid adding salt when cooking. Use salt-free seasonings or herbs instead of table salt or sea salt. Check with your health care provider or pharmacist before using salt substitutes.  Do not fry foods. Cook foods using healthy methods such as baking, boiling, grilling, and broiling instead.  Cook with heart-healthy oils, such as olive, canola, soybean, or sunflower oil. Meal planning   Eat a balanced diet that includes: ? 5 or more servings of fruits  and vegetables each day. At each meal, try to fill half of your plate with fruits and vegetables. ? Up to 6-8 servings of whole grains each day. ? Less than 6 oz of lean meat, poultry, or fish each day. A 3-oz serving of meat is about the same size as a deck of cards. One egg equals 1 oz. ? 2 servings of low-fat dairy each day. ? A serving of nuts, seeds, or beans 5 times each week. ? Heart-healthy fats. Healthy fats called Omega-3 fatty acids are found in  foods such as flaxseeds and coldwater fish, like sardines, salmon, and mackerel.  Limit how much you eat of the following: ? Canned or prepackaged foods. ? Food that is high in trans fat, such as fried foods. ? Food that is high in saturated fat, such as fatty meat. ? Sweets, desserts, sugary drinks, and other foods with added sugar. ? Full-fat dairy products.  Do not salt foods before eating.  Try to eat at least 2 vegetarian meals each week.  Eat more home-cooked food and less restaurant, buffet, and fast food.  When eating at a restaurant, ask that your food be prepared with less salt or no salt, if possible. What foods are recommended? The items listed may not be a complete list. Talk with your dietitian about what dietary choices are best for you. Grains Whole-grain or whole-wheat bread. Whole-grain or whole-wheat pasta. Brown rice. Orpah Cobb. Bulgur. Whole-grain and low-sodium cereals. Pita bread. Low-fat, low-sodium crackers. Whole-wheat flour tortillas. Vegetables Fresh or frozen vegetables (raw, steamed, roasted, or grilled). Low-sodium or reduced-sodium tomato and vegetable juice. Low-sodium or reduced-sodium tomato sauce and tomato paste. Low-sodium or reduced-sodium canned vegetables. Fruits All fresh, dried, or frozen fruit. Canned fruit in natural juice (without added sugar). Meat and other protein foods Skinless chicken or [place]. Ground chicken or [place]. Pork with fat trimmed off. Fish and seafood. Egg whites. Dried beans, peas, or lentils. Unsalted nuts, nut butters, and seeds. Unsalted canned beans. Lean cuts of beef with fat trimmed off. Low-sodium, lean deli meat. Dairy Low-fat (1%) or fat-free (skim) milk. Fat-free, low-fat, or reduced-fat cheeses. Nonfat, low-sodium ricotta or cottage cheese. Low-fat or nonfat yogurt. Low-fat, low-sodium cheese. Fats and oils Soft margarine without trans fats. Vegetable oil. Low-fat, reduced-fat, or light mayonnaise and salad  dressings (reduced-sodium). Canola, safflower, olive, soybean, and sunflower oils. Avocado. Seasoning and other foods Herbs. Spices. Seasoning mixes without salt. Unsalted popcorn and pretzels. Fat-free sweets. What foods are not recommended? The items listed may not be a complete list. Talk with your dietitian about what dietary choices are best for you. Grains Baked goods made with fat, such as croissants, muffins, or some breads. Dry pasta or rice meal packs. Vegetables Creamed or fried vegetables. Vegetables in a cheese sauce. Regular canned vegetables (not low-sodium or reduced-sodium). Regular canned tomato sauce and paste (not low-sodium or reduced-sodium). Regular tomato and vegetable juice (not low-sodium or reduced-sodium). Rosita Fire. Olives. Fruits Canned fruit in a light or heavy syrup. Fried fruit. Fruit in cream or butter sauce. Meat and other protein foods Fatty cuts of meat. Ribs. Fried meat. Tomasa Blase. Sausage. Bologna and other processed lunch meats. Salami. Fatback. Hotdogs. Bratwurst. Salted nuts and seeds. Canned beans with added salt. Canned or smoked fish. Whole eggs or egg yolks. Chicken or [place] with skin. Dairy Whole or 2% milk, cream, and half-and-half. Whole or full-fat cream cheese. Whole-fat or sweetened yogurt. Full-fat cheese. Nondairy creamers. Whipped toppings. Processed cheese and cheese spreads. Fats and  oils Butter. Stick margarine. Lard. Shortening. Ghee. Bacon fat. Tropical oils, such as coconut, palm kernel, or palm oil. Seasoning and other foods Salted popcorn and pretzels. Onion salt, garlic salt, seasoned salt, table salt, and sea salt. Worcestershire sauce. Tartar sauce. Barbecue sauce. Teriyaki sauce. Soy sauce, including reduced-sodium. Steak sauce. Canned and packaged gravies. Fish sauce. Oyster sauce. Cocktail sauce. Horseradish that you find on the shelf. Ketchup. Mustard. Meat flavorings and tenderizers. Bouillon cubes. Hot sauce and Tabasco sauce.  Premade or packaged marinades. Premade or packaged taco seasonings. Relishes. Regular salad dressings. Where to find more information:  National Heart, Lung, and Blood Institute: [employer].iswww.nhlbi.nih.gov  American Heart Association: www.heart.org Summary  The DASH eating plan is a healthy eating plan that has been shown to reduce high blood pressure (hypertension). It may also reduce your risk for type 2 diabetes, heart disease, and stroke.  With the DASH eating plan, you should limit salt (sodium) intake to 2,300 mg a day. If you have hypertension, you may need to reduce your sodium intake to 1,500 mg a day.  When on the DASH eating plan, aim to eat more fresh fruits and vegetables, whole grains, lean proteins, low-fat dairy, and heart-healthy fats.  Work with your health care provider or diet and nutrition specialist (dietitian) to adjust your eating plan to your individual calorie needs. This information is not intended to replace advice given to you by your health care provider. Make sure you discuss any questions you have with your health care provider. Document Released: 05/14/2011 Document Revised: 05/18/2016 Document Reviewed: 05/18/2016 Elsevier Interactive Patient Education  [employer]2018 Else[employer] Inc.

## 2018-05-11 NOTE — Assessment & Plan Note (Signed)
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
## 2018-05-12 LAB — CBC WITH DIFFERENTIAL/PLATELET
Basophils Absolute: 53 cells/uL (ref 0–200)
Basophils Relative: 1 %
Eosinophils Absolute: 53 cells/uL (ref 15–500)
Eosinophils Relative: 1 %
HCT: 38.8 % (ref 35.0–45.0)
Hemoglobin: 13 g/dL (ref 11.7–15.5)
Lymphs Abs: 2274 cells/uL (ref 850–3900)
MCH: 28.6 pg (ref 27.0–33.0)
MCHC: 33.5 g/dL (ref 32.0–36.0)
MCV: 85.3 fL (ref 80.0–100.0)
MPV: 10 fL (ref 7.5–12.5)
Monocytes Relative: 6.3 %
Neutro Abs: 2586 cells/uL (ref 1500–7800)
Neutrophils Relative %: 48.8 %
Platelets: 354 10*3/uL (ref 140–400)
RBC: 4.55 10*6/uL (ref 3.80–5.10)
RDW: 12.9 % (ref 11.0–15.0)
Total Lymphocyte: 42.9 %
WBC mixed population: 334 cells/uL (ref 200–950)
WBC: 5.3 10*3/uL (ref 3.8–10.8)

## 2018-05-12 LAB — COMPLETE METABOLIC PANEL WITH GFR
AG Ratio: 1.3 (calc) (ref 1.0–2.5)
ALT: 13 U/L (ref 6–29)
AST: 15 U/L (ref 10–35)
Albumin: 4.3 g/dL (ref 3.6–5.1)
Alkaline phosphatase (APISO): 77 U/L (ref 33–130)
BUN: 15 mg/dL (ref 7–25)
CHLORIDE: 104 mmol/L (ref 98–110)
CO2: 26 mmol/L (ref 20–32)
Calcium: 9.7 mg/dL (ref 8.6–10.4)
Creat: 0.82 mg/dL (ref 0.50–0.99)
GFR, Est African American: 90 mL/min/{1.73_m2} (ref >=60)
GFR, Est Non African American: 77 mL/min/{1.73_m2} (ref >=60)
Globulin: 3.2 g/dL (calc) (ref 1.9–3.7)
Glucose, Bld: 89 mg/dL (ref 65–99)
Potassium: 4.3 mmol/L (ref 3.5–5.3)
Sodium: 138 mmol/L (ref 135–146)
Total Bilirubin: 0.4 mg/dL (ref 0.2–1.2)
Total Protein: 7.5 g/dL (ref 6.1–8.1)

## 2018-05-12 LAB — LIPID PANEL
Cholesterol: 258 mg/dL — ABNORMAL HIGH (ref <200)
HDL: 57 mg/dL (ref >50)
LDL Cholesterol (Calc): 168 mg/dL (calc) — ABNORMAL HIGH
Non-HDL Cholesterol (Calc): 201 mg/dL (calc) — ABNORMAL HIGH (ref <130)
Total CHOL/HDL Ratio: 4.5 (calc) (ref <5.0)
Triglycerides: 179 mg/dL — ABNORMAL HIGH (ref <150)

## 2018-05-12 LAB — TSH: TSH: 3.55 m[IU]/L (ref 0.40–4.50)

## 2018-05-12 LAB — MAGNESIUM: Magnesium: 2.2 mg/dL (ref 1.5–2.5)

## 2018-05-12 NOTE — Progress Notes (Signed)
Tonya Murray, please let the patient know that her cholesterol is indeed high; with her gender, age, BP, and cholesterol, we can calculate her risk of having a heart attack or stroke; her risk is 6.6% over the next 10 years; I will recommend a statin for this to help lower her cholesterol and try to lower her risk of having a heart attack or stroke; let me know if she is interested in medicine; of course, please recommend healthy diet; her other labs look great  The 10-year ASCVD risk score Tonya Murray(Goff DC Montez HagemanJr., et al., 2013) is: 6.6%   Values used to calculate the score:     Age: [age] years     Sex: Female     Is Non-Hispanic African American: No     Diabetic: No     Tobacco smoker: No     Systolic Blood Pressure: 160 mmHg     Is BP treated: No     HDL Cholesterol: 57 mg/dL     Total Cholesterol: 258 mg/dL

## 2018-05-13 ENCOUNTER — Encounter
Admission: RE | Admit: 2018-05-13 | Discharge: 2018-05-13 | Disposition: A | Discharge status: Home / Self Care | Payer: BLUE CROSS/BLUE SHIELD | Source: Ambulatory Visit | Attending: Obstetrics & Gynecology | Admitting: Obstetrics & Gynecology

## 2018-05-13 ENCOUNTER — Other Ambulatory Visit: Payer: Self-pay

## 2018-05-13 ENCOUNTER — Encounter: Payer: Self-pay | Admitting: *Deleted

## 2018-05-13 ENCOUNTER — Encounter: Payer: BLUE CROSS/BLUE SHIELD | Admitting: Obstetrics & Gynecology

## 2018-05-13 DIAGNOSIS — Z01818 Encounter for other preprocedural examination: Secondary | ICD-10-CM | POA: Diagnosis present

## 2018-05-13 DIAGNOSIS — I10 Essential (primary) hypertension: Secondary | ICD-10-CM | POA: Insufficient documentation

## 2018-05-13 DIAGNOSIS — R9431 Abnormal electrocardiogram [ECG] [EKG]: Secondary | ICD-10-CM | POA: Insufficient documentation

## 2018-05-13 DIAGNOSIS — R001 Bradycardia, unspecified: Secondary | ICD-10-CM | POA: Diagnosis not present

## 2018-05-13 LAB — TYPE AND SCREEN
ABO/RH(D): O POS
Antibody Screen: NEGATIVE

## 2018-05-13 LAB — BASIC METABOLIC PANEL
Anion gap: 6 (ref 5–15)
BUN: 17 mg/dL (ref 8–23)
CO2: 25 mmol/L (ref 22–32)
Calcium: 8.8 mg/dL — ABNORMAL LOW (ref 8.9–10.3)
Chloride: 107 mmol/L (ref 98–111)
Creatinine, Ser: 0.82 mg/dL (ref 0.44–1.00)
GFR calc Af Amer: >60 mL/min (ref >60)
GFR calc non Af Amer: >60 mL/min (ref >60)
Glucose, Bld: 97 mg/dL (ref 70–99)
Potassium: 4 mmol/L (ref 3.5–5.1)
Sodium: 138 mmol/L (ref 135–145)

## 2018-05-13 LAB — CBC
HCT: 38.7 % (ref 36.0–46.0)
Hemoglobin: 12.3 g/dL (ref 12.0–15.0)
MCH: 28.1 pg (ref 26.0–34.0)
MCHC: 31.8 g/dL (ref 30.0–36.0)
MCV: 88.4 fL (ref 80.0–100.0)
Platelets: 326 10*3/uL (ref 150–400)
RBC: 4.38 MIL/uL (ref 3.87–5.11)
RDW: 13.7 % (ref 11.5–15.5)
WBC: 6 10*3/uL (ref 4.0–10.5)
nRBC: 0 % (ref 0.0–0.2)

## 2018-05-13 LAB — APTT: aPTT: 28 seconds (ref 24–36)

## 2018-05-13 LAB — PROTIME-INR
INR: 0.9
Prothrombin Time: 12.1 seconds (ref 11.4–15.2)

## 2018-05-13 NOTE — Pre-Procedure Instructions (Signed)
Carb drink given as instructed.

## 2018-05-13 NOTE — Patient Instructions (Signed)
Your procedure is scheduled on: 05/26/18 Thurs Report to Same Day Surgery 2nd floor medical mall [facility](Medical Mall Entrance-take elevator on left to 2nd floor.  Check in with surgery information desk.) To find out your arrival time please call [phone] between 1PM - 3PM on 05/25/18 Wed  Remember: Instructions that are not followed completely may result in serious medical risk, up to and including death, or upon the discretion of your surgeon and anesthesiologist your surgery may need to be rescheduled.    _x___ 1. Do not eat food after midnight the night before your procedure. You may drink clear liquids up to 2 hours before you are scheduled to arrive at the hospital for your procedure.  Do not drink clear liquids within 2 hours of your scheduled arrival to the hospital.  Clear liquids include  --Water or Apple juice without pulp  --Clear carbohydrate beverage such as ClearFast or Gatorade  --Black Coffee or Clear Tea (No milk, no creamers, do not add anything to                  the coffee or Tea Type 1 and type 2 diabetics should only drink water.   ____Ensure clear carbohydrate drink on the way to the hospital for bariatric patients  ____Ensure clear carbohydrate drink 3 hours before surgery for Dr Rutherford NailByrnett's patients if physician instructed.   No gum chewing or hard candies.     __x__ 2. No Alcohol for 24 hours before or after surgery.   __x__3. No Smoking or e-cigarettes for 24 prior to surgery.  Do not use any chewable tobacco products for at least 6 hour prior to surgery   ____  4. Bring all medications with you on the day of surgery if instructed.    __x__ 5. Notify your doctor if there is any change in your medical condition     (cold, fever, infections).    x___6. On the morning of surgery brush your teeth with toothpaste and water.  You may rinse your mouth with mouth wash if you wish.  Do not swallow any toothpaste or mouthwash.   Do not wear jewelry, make-up, hairpins,  clips or nail polish.  Do not wear lotions, powders, or perfumes. You may wear deodorant.  Do not shave 48 hours prior to surgery. Men may shave face and neck.  Do not bring valuables to the hospital.    [facility] is not responsible for any belongings or valuables.               Contacts, dentures or bridgework may not be worn into surgery.  Leave your suitcase in the car. After surgery it may be brought to your room.  For patients admitted to the hospital, discharge time is determined by your                       treatment team.  _  Patients discharged the day of surgery will not be allowed to drive home.  You will need someone to drive you home and stay with you the night of your procedure.    Please read over the following fact sheets that you were given:   [facility] Preparing for Surgery and or MRSA Information   _x___ Take anti-hypertensive listed below, cardiac, seizure, asthma,     anti-reflux and psychiatric medicines. These include:  1. None  2.  3.  4.  5.  6.  ____Fleets enema or Magnesium Citrate as directed.  _x___ Use CHG Soap or sage wipes as directed on instruction sheet   ____ Use inhalers on the day of surgery and bring to hospital day of surgery  ____ Stop Metformin and Janumet 2 days prior to surgery.    ____ Take 1/2 of usual insulin dose the night before surgery and none on the morning     surgery.   _x___ Follow recommendations from Cardiologist, Pulmonologist or PCP regarding          stopping Aspirin, Coumadin, Plavix ,Eliquis, Effient, or Pradaxa, and Pletal.  X____Stop Anti-inflammatories such as Advil, Aleve, Ibuprofen, Motrin, Naproxen, Naprosyn, Goodies powders or aspirin products. OK to take Tylenol and                          Celebrex.   _x___ Stop supplements until after surgery.  But may continue Vitamin D, Vitamin B,       and multivitamin.   ____ Bring C-Pap to the hospital.    

## 2018-05-19 ENCOUNTER — Ambulatory Visit
Admission: RE | Admit: 2018-05-19 | Discharge: 2018-05-19 | Disposition: A | Discharge status: Home / Self Care | Payer: BLUE CROSS/BLUE SHIELD | Source: Ambulatory Visit | Attending: Family Medicine | Admitting: Family Medicine

## 2018-05-19 ENCOUNTER — Other Ambulatory Visit: Payer: BLUE CROSS/BLUE SHIELD

## 2018-05-19 DIAGNOSIS — G319 Degenerative disease of nervous system, unspecified: Secondary | ICD-10-CM | POA: Insufficient documentation

## 2018-05-19 DIAGNOSIS — R29810 Facial weakness: Secondary | ICD-10-CM | POA: Insufficient documentation

## 2018-05-19 DIAGNOSIS — Z823 Family history of stroke: Secondary | ICD-10-CM | POA: Diagnosis not present

## 2018-05-19 DIAGNOSIS — Z820 Family history of epilepsy and other diseases of the nervous system: Secondary | ICD-10-CM

## 2018-05-20 ENCOUNTER — Telehealth: Payer: Self-pay | Admitting: Family Medicine

## 2018-05-20 ENCOUNTER — Other Ambulatory Visit: Payer: Self-pay | Admitting: Family Medicine

## 2018-05-20 DIAGNOSIS — G319 Degenerative disease of nervous system, unspecified: Secondary | ICD-10-CM

## 2018-05-20 DIAGNOSIS — I6523 Occlusion and stenosis of bilateral carotid arteries: Secondary | ICD-10-CM | POA: Insufficient documentation

## 2018-05-20 DIAGNOSIS — Z8673 Personal history of transient ischemic attack (TIA), and cerebral infarction without residual deficits: Secondary | ICD-10-CM | POA: Insufficient documentation

## 2018-05-20 MED ORDER — ROSUVASTATIN CALCIUM 10 MG PO TABS: ORAL_TABLET | ORAL | 1 refills | Status: DC

## 2018-05-20 NOTE — Progress Notes (Signed)
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

## 2018-05-20 NOTE — Assessment & Plan Note (Signed)
Noted on scan

## 2018-05-20 NOTE — Telephone Encounter (Signed)
Melissa -- please forward the new information (the brain imaging and the carotid US report) to the neurologist; previous referral; I want to make sure she gets in soon

## 2018-05-24 ENCOUNTER — Ambulatory Visit (INDEPENDENT_AMBULATORY_CARE_PROVIDER_SITE_OTHER): Payer: BLUE CROSS/BLUE SHIELD | Admitting: Obstetrics & Gynecology

## 2018-05-24 ENCOUNTER — Encounter: Payer: Self-pay | Admitting: Obstetrics & Gynecology

## 2018-05-24 VITALS — BP 150/80 | Ht 68.0 in | Wt 156.0 lb

## 2018-05-24 DIAGNOSIS — N812 Incomplete uterovaginal prolapse: Secondary | ICD-10-CM | POA: Diagnosis not present

## 2018-05-24 DIAGNOSIS — N8111 Cystocele, midline: Secondary | ICD-10-CM | POA: Diagnosis not present

## 2018-05-24 NOTE — Patient Instructions (Signed)
Vaginal Hysterectomy, Care After °Refer to this sheet in the next few weeks. These instructions provide you with information about caring for yourself after your procedure. Your health care provider may also give you more specific instructions. Your treatment has been planned according to current medical practices, but problems sometimes occur. Call your health care provider if you have any problems or questions after your procedure. °What can I expect after the procedure? °After the procedure, it is common to have: °· Pain. °· Soreness and numbness in your incision areas. °· Vaginal bleeding and discharge. °· Constipation. °· Temporary problems emptying the bladder. °· Feelings of sadness or other emotions. ° °Follow these instructions at home: °Medicines °· Take over-the-counter and prescription medicines only as told by your health care provider. °· If you were prescribed an antibiotic medicine, take it as told by your health care provider. Do not stop taking the antibiotic even if you start to feel better. °· Do not drive or operate heavy machinery while taking prescription pain medicine. °Activity °· Return to your normal activities as told by your health care provider. Ask your health care provider what activities are safe for you. °· Get regular exercise as told by your health care provider. You may be told to take short walks every day and go farther each time. °· Do not lift anything that is heavier than 10 lb (4.5 kg). °General instructions ° °· Do not put anything in your vagina for 6 weeks after your surgery or as told by your health care provider. This includes tampons and douches. °· Do not have sex until your health care provider says you can. °· Do not take baths, swim, or use a hot tub until your health care provider approves. °· Drink enough fluid to keep your urine clear or pale yellow. °· Do not drive for 24 hours if you were given a sedative. °· Keep all follow-up visits as told by your health  care provider. This is important. °Contact a health care provider if: °· Your pain medicine is not helping. °· You have a fever. °· You have redness, swelling, or pain at your incision site. °· You have blood, pus, or a bad-smelling discharge from your vagina. °· You continue to have difficulty urinating. °Get help right away if: °· You have severe abdominal or back pain. °· You have heavy bleeding from your vagina. °· You have chest pain or shortness of breath. °This information is not intended to replace advice given to you by your health care provider. Make sure you discuss any questions you have with your health care provider. °Document Released: 09/16/2015 Document Revised: 10/31/2015 Document Reviewed: 06/09/2015 °Elsevier Interactive Patient Education © 2018 [employer] ° °

## 2018-05-24 NOTE — Progress Notes (Signed)
PRE-OPERATIVE HISTORY AND PHYSICAL EXAM  HPI:  Tonya Murray is a 62 y.o. G3P3003 No LMP recorded. Patient is postmenopausal.; she is being admitted for surgery related to pelvic relaxation.  She complains of a vaginal uncomfortable bulge that is bothersome and interferes w work and other activities.  She is still sexually active at rare occasions.  SHe has no bleeding and has passed thru menopause.  No discharge, uregncy, frrquency, nocturia, or leakage of urine.. Problem started several months ago. Symptoms include: prolapse of tissue with straining and discomfort: moderate. Symptoms have gradually worsened.  She has h/o NSVD x3 years ago.  Her career has not involved heavy lifting.  PMHx: Past Medical History:  Diagnosis Date  . Bladder prolapse, female, acquired   . Chest pain   . HTN (hypertension)   . Normal cardiac stress test 2000s   normal with palps  . Postmenopausal    Past Surgical History:  Procedure Laterality Date  . DILATION AND CURETTAGE OF UTERUS     after one of her deliveries   . TONSILLECTOMY    . TRACHEOSTOMY CLOSURE  1980   Family History  Problem Relation Age of Onset  . Arrhythmia Mother   . Diabetes Father   . Cholelithiasis Father   . Diabetes Sister   . Cervical cancer Maternal Grandmother   . Bladder Cancer Neg Hx   . Kidney cancer Neg Hx    Social History   Tobacco Use  . Smoking status: Former Smoker    Last attempt to quit: 05/13/1976    Years since quitting: 42.0  . Smokeless tobacco: Never Used  Substance Use Topics  . Alcohol use: No  . Drug use: No    Current Outpatient Medications:  .  aspirin 325 MG EC tablet, Take 325 mg by mouth daily., Disp: , Rfl:  .  rosuvastatin (CRESTOR) 10 MG tablet, Take one pill by mouth twice a week, Disp: 26 tablet, Rfl: 1 Allergies: Dilantin [phenytoin sodium extended]  Review of Systems  Constitutional: Negative for chills, fever and malaise/fatigue.  HENT: Negative for congestion, sinus pain and  sore throat.   Eyes: Negative for blurred vision and pain.  Respiratory: Negative for cough and wheezing.   Cardiovascular: Negative for chest pain and leg swelling.  Gastrointestinal: Negative for abdominal pain, constipation, diarrhea, heartburn, nausea and vomiting.  Genitourinary: Negative for dysuria, frequency, hematuria and urgency.  Musculoskeletal: Negative for back pain, joint pain, myalgias and neck pain.  Skin: Negative for itching and rash.  Neurological: Negative for dizziness, tremors and weakness.  Endo/Heme/Allergies: Does not bruise/bleed easily.  Psychiatric/Behavioral: Negative for depression. The patient is not nervous/anxious and does not have insomnia.     Objective: There were no vitals taken for this visit. There were no vitals filed for this visit. Physical Exam Constitutional:      General: She is not in acute distress.    Appearance: She is well-developed.  Genitourinary:     Genitourinary Comments: Prior exam: Cystocele Gr 3, Uterine prolapse Gr3, Rectocele Gr 1 Min atrophy No cervical lesions, small uterus, no adnexal mass   HENT:     Head: Normocephalic and atraumatic. No laceration.     Right Ear: Hearing normal.     Left Ear: Hearing normal.     Mouth/Throat:     Pharynx: Uvula midline.  Eyes:     Pupils: Pupils are equal, round, and reactive to light.  Neck:     Musculoskeletal: Normal range of motion  and neck supple.     Thyroid: No thyromegaly.  Cardiovascular:     Rate and Rhythm: Normal rate and regular rhythm.     Heart sounds: No murmur. No friction rub. No gallop.   Pulmonary:     Effort: Pulmonary effort is normal. No respiratory distress.     Breath sounds: Normal breath sounds. No wheezing.  Chest:     Breasts:        Right: No mass, skin change or tenderness.        Left: No mass, skin change or tenderness.  Abdominal:     General: Bowel sounds are normal. There is no distension.     Palpations: Abdomen is soft.      Tenderness: There is no abdominal tenderness. There is no rebound.  Musculoskeletal: Normal range of motion.  Neurological:     Mental Status: She is alert and oriented to person, place, and time.     Cranial Nerves: No cranial nerve deficit.  Skin:    General: Skin is warm and dry.  Psychiatric:        Judgment: Judgment normal.  Vitals signs reviewed.     Assessment: 1. Cystocele, midline   2. Incomplete uterine prolapse   Plans TVH, BSO, AP repair.  I have had a careful discussion with this patient about all the options available and the risk/benefits of each. I have fully informed this patient that surgery may subject her to a variety of discomforts and risks: She understands that most patients have surgery with little difficulty, but problems can happen ranging from minor to fatal. These include nausea, vomiting, pain, bleeding, infection, poor healing, hernia, or formation of adhesions. Unexpected reactions may occur from any drug or anesthetic given. Unintended injury may occur to other pelvic or abdominal structures such as Fallopian tubes, ovaries, bladder, ureter (tube from kidney to bladder), or bowel. Nerves going from the pelvis to the legs may be injured. Any such injury may require immediate or later additional surgery to correct the problem. Excessive blood loss requiring transfusion is very unlikely but possible. Dangerous blood clots may form in the legs or lungs. Physical and sexual activity will be restricted in varying degrees for an indeterminate period of time but most often 2-6 weeks.  Finally, she understands that it is impossible to list every possible undesirable effect and that the condition for which surgery is done is not always cured or significantly improved, and in rare cases may be even worse.Ample time was given to answer all questions.  Annamarie MajorPaul Takerra Lupinacci, MD, Merlinda FrederickFACOG [facility], [facility] 05/24/2018  11:19 AM

## 2018-05-24 NOTE — H&P (View-Only) (Signed)
   PRE-OPERATIVE HISTORY AND PHYSICAL EXAM  HPI:  Tonya Murray is a 62 y.o. G3P3003 No LMP recorded. Patient is postmenopausal.; she is being admitted for surgery related to pelvic relaxation.  She complains of a vaginal uncomfortable bulge that is bothersome and interferes w work and other activities.  She is still sexually active at rare occasions.  SHe has no bleeding and has passed thru menopause.  No discharge, uregncy, frrquency, nocturia, or leakage of urine.. Problem started several months ago. Symptoms include: prolapse of tissue with straining and discomfort: moderate. Symptoms have gradually worsened.  She has h/o NSVD x3 years ago.  Her career has not involved heavy lifting.  PMHx: Past Medical History:  Diagnosis Date  . Bladder prolapse, female, acquired   . Chest pain   . HTN (hypertension)   . Normal cardiac stress test 2000s   normal with palps  . Postmenopausal    Past Surgical History:  Procedure Laterality Date  . DILATION AND CURETTAGE OF UTERUS     after one of her deliveries   . TONSILLECTOMY    . TRACHEOSTOMY CLOSURE  1980   Family History  Problem Relation Age of Onset  . Arrhythmia Mother   . Diabetes Father   . Cholelithiasis Father   . Diabetes Sister   . Cervical cancer Maternal Grandmother   . Bladder Cancer Neg Hx   . Kidney cancer Neg Hx    Social History   Tobacco Use  . Smoking status: Former Smoker    Last attempt to quit: 05/13/1976    Years since quitting: 42.0  . Smokeless tobacco: Never Used  Substance Use Topics  . Alcohol use: No  . Drug use: No    Current Outpatient Medications:  .  aspirin 325 MG EC tablet, Take 325 mg by mouth daily., Disp: , Rfl:  .  rosuvastatin (CRESTOR) 10 MG tablet, Take one pill by mouth twice a week, Disp: 26 tablet, Rfl: 1 Allergies: Dilantin [phenytoin sodium extended]  Review of Systems  Constitutional: Negative for chills, fever and malaise/fatigue.  HENT: Negative for congestion, sinus pain and  sore throat.   Eyes: Negative for blurred vision and pain.  Respiratory: Negative for cough and wheezing.   Cardiovascular: Negative for chest pain and leg swelling.  Gastrointestinal: Negative for abdominal pain, constipation, diarrhea, heartburn, nausea and vomiting.  Genitourinary: Negative for dysuria, frequency, hematuria and urgency.  Musculoskeletal: Negative for back pain, joint pain, myalgias and neck pain.  Skin: Negative for itching and rash.  Neurological: Negative for dizziness, tremors and weakness.  Endo/Heme/Allergies: Does not bruise/bleed easily.  Psychiatric/Behavioral: Negative for depression. The patient is not nervous/anxious and does not have insomnia.     Objective: There were no vitals taken for this visit. There were no vitals filed for this visit. Physical Exam Constitutional:      General: She is not in acute distress.    Appearance: She is well-developed.  Genitourinary:     Genitourinary Comments: Prior exam: Cystocele Gr 3, Uterine prolapse Gr3, Rectocele Gr 1 Min atrophy No cervical lesions, small uterus, no adnexal mass   HENT:     Head: Normocephalic and atraumatic. No laceration.     Right Ear: Hearing normal.     Left Ear: Hearing normal.     Mouth/Throat:     Pharynx: Uvula midline.  Eyes:     Pupils: Pupils are equal, round, and reactive to light.  Neck:     Musculoskeletal: Normal range of motion   and neck supple.     Thyroid: No thyromegaly.  Cardiovascular:     Rate and Rhythm: Normal rate and regular rhythm.     Heart sounds: No murmur. No friction rub. No gallop.   Pulmonary:     Effort: Pulmonary effort is normal. No respiratory distress.     Breath sounds: Normal breath sounds. No wheezing.  Chest:     Breasts:        Right: No mass, skin change or tenderness.        Left: No mass, skin change or tenderness.  Abdominal:     General: Bowel sounds are normal. There is no distension.     Palpations: Abdomen is soft.      Tenderness: There is no abdominal tenderness. There is no rebound.  Musculoskeletal: Normal range of motion.  Neurological:     Mental Status: She is alert and oriented to person, place, and time.     Cranial Nerves: No cranial nerve deficit.  Skin:    General: Skin is warm and dry.  Psychiatric:        Judgment: Judgment normal.  Vitals signs reviewed.     Assessment: 1. Cystocele, midline   2. Incomplete uterine prolapse   Plans TVH, BSO, AP repair.  I have had a careful discussion with this patient about all the options available and the risk/benefits of each. I have fully informed this patient that surgery may subject her to a variety of discomforts and risks: She understands that most patients have surgery with little difficulty, but problems can happen ranging from minor to fatal. These include nausea, vomiting, pain, bleeding, infection, poor healing, hernia, or formation of adhesions. Unexpected reactions may occur from any drug or anesthetic given. Unintended injury may occur to other pelvic or abdominal structures such as Fallopian tubes, ovaries, bladder, ureter (tube from kidney to bladder), or bowel. Nerves going from the pelvis to the legs may be injured. Any such injury may require immediate or later additional surgery to correct the problem. Excessive blood loss requiring transfusion is very unlikely but possible. Dangerous blood clots may form in the legs or lungs. Physical and sexual activity will be restricted in varying degrees for an indeterminate period of time but most often 2-6 weeks.  Finally, she understands that it is impossible to list every possible undesirable effect and that the condition for which surgery is done is not always cured or significantly improved, and in rare cases may be even worse.Ample time was given to answer all questions.  Paul Deyja Sochacki, MD, FACOG [facility], [facility] 05/24/2018  11:19 AM  

## 2018-05-25 ENCOUNTER — Ambulatory Visit: Payer: BLUE CROSS/BLUE SHIELD

## 2018-05-25 VITALS — BP 126/82 | HR 76

## 2018-05-25 DIAGNOSIS — I159 Secondary hypertension, unspecified: Secondary | ICD-10-CM

## 2018-05-25 MED ORDER — SODIUM CHLORIDE 0.9 % IV SOLN
2.0000 g | INTRAVENOUS | Status: AC
  Administered 2018-05-26: 2 g via INTRAVENOUS

## 2018-05-25 NOTE — Progress Notes (Signed)
Pt here for BP check. Not on any medications. No symptoms. BP today was 126/82, HR was 76. Dr. Sherie DonLada notified.

## 2018-05-25 NOTE — Telephone Encounter (Signed)
Please see my note and respond/document

## 2018-05-26 ENCOUNTER — Encounter
Admission: RE | Disposition: A | Discharge status: Home / Self Care | Payer: Self-pay | Source: Ambulatory Visit | Attending: Obstetrics & Gynecology

## 2018-05-26 ENCOUNTER — Ambulatory Visit
Admission: RE | Admit: 2018-05-26 | Discharge: 2018-05-26 | Disposition: A | Discharge status: Home / Self Care | Payer: BLUE CROSS/BLUE SHIELD | Source: Ambulatory Visit | Attending: Obstetrics & Gynecology | Admitting: Obstetrics & Gynecology

## 2018-05-26 ENCOUNTER — Ambulatory Visit: Payer: BLUE CROSS/BLUE SHIELD | Admitting: Anesthesiology

## 2018-05-26 DIAGNOSIS — I10 Essential (primary) hypertension: Secondary | ICD-10-CM | POA: Insufficient documentation

## 2018-05-26 DIAGNOSIS — N858 Other specified noninflammatory disorders of uterus: Secondary | ICD-10-CM

## 2018-05-26 DIAGNOSIS — Z7982 Long term (current) use of aspirin: Secondary | ICD-10-CM | POA: Insufficient documentation

## 2018-05-26 DIAGNOSIS — N888 Other specified noninflammatory disorders of cervix uteri: Secondary | ICD-10-CM | POA: Diagnosis not present

## 2018-05-26 DIAGNOSIS — Z87891 Personal history of nicotine dependence: Secondary | ICD-10-CM | POA: Insufficient documentation

## 2018-05-26 DIAGNOSIS — N812 Incomplete uterovaginal prolapse: Secondary | ICD-10-CM | POA: Insufficient documentation

## 2018-05-26 DIAGNOSIS — N8111 Cystocele, midline: Secondary | ICD-10-CM | POA: Diagnosis present

## 2018-05-26 DIAGNOSIS — N814 Uterovaginal prolapse, unspecified: Secondary | ICD-10-CM

## 2018-05-26 HISTORY — DX: Cystocele, unspecified: N81.10

## 2018-05-26 HISTORY — PX: ANTERIOR AND POSTERIOR REPAIR WITH SACROSPINOUS FIXATION: SHX6536

## 2018-05-26 HISTORY — PX: VAGINAL HYSTERECTOMY: SHX2639

## 2018-05-26 LAB — ABO/RH: ABO/RH(D): O POS

## 2018-05-26 SURGERY — HYSTERECTOMY, VAGINAL
Anesthesia: General

## 2018-05-26 MED ORDER — ACETAMINOPHEN 325 MG PO TABS: 650.0000 mg | ORAL_TABLET | ORAL | Status: DC | PRN

## 2018-05-26 MED ORDER — LACTATED RINGERS IV SOLN: INTRAVENOUS | Status: DC

## 2018-05-26 MED ORDER — FENTANYL CITRATE (PF) 100 MCG/2ML IJ SOLN
25.0000 ug | INTRAMUSCULAR | Status: AC | PRN
  Administered 2018-05-26 (×6): 25 ug via INTRAVENOUS

## 2018-05-26 MED ORDER — LACTATED RINGERS IV SOLN
INTRAVENOUS | Status: DC
  Administered 2018-05-26: 18:00:00 via INTRAVENOUS

## 2018-05-26 MED ORDER — LIDOCAINE HCL (PF) 2 % IJ SOLN
INTRAMUSCULAR | Status: AC
  Filled 2018-05-26: qty 10

## 2018-05-26 MED ORDER — LIDOCAINE HCL (CARDIAC) PF 100 MG/5ML IV SOSY
PREFILLED_SYRINGE | INTRAVENOUS | Status: DC | PRN
  Administered 2018-05-26: 30 mg via INTRAVENOUS

## 2018-05-26 MED ORDER — VECURONIUM BROMIDE 10 MG IV SOLR
INTRAVENOUS | Status: DC | PRN
  Administered 2018-05-26: 1 mg via INTRAVENOUS

## 2018-05-26 MED ORDER — LIDOCAINE-EPINEPHRINE 1 %-1:100000 IJ SOLN
INTRAMUSCULAR | Status: AC
  Filled 2018-05-26: qty 1

## 2018-05-26 MED ORDER — SUCCINYLCHOLINE CHLORIDE 20 MG/ML IJ SOLN
INTRAMUSCULAR | Status: DC | PRN
  Administered 2018-05-26: 80 mg via INTRAVENOUS

## 2018-05-26 MED ORDER — FENTANYL CITRATE (PF) 100 MCG/2ML IJ SOLN
INTRAMUSCULAR | Status: DC | PRN
  Administered 2018-05-26: 50 ug via INTRAVENOUS

## 2018-05-26 MED ORDER — OXYCODONE-ACETAMINOPHEN 5-325 MG PO TABS: 1.0000 | ORAL_TABLET | ORAL | Status: DC | PRN

## 2018-05-26 MED ORDER — FENTANYL CITRATE (PF) 100 MCG/2ML IJ SOLN
INTRAMUSCULAR | Status: AC
  Administered 2018-05-26: 25 ug via INTRAVENOUS
  Filled 2018-05-26: qty 2

## 2018-05-26 MED ORDER — ACETAMINOPHEN 650 MG RE SUPP: 650.0000 mg | RECTAL | Status: DC | PRN

## 2018-05-26 MED ORDER — ESTROGENS, CONJUGATED 0.625 MG/GM VA CREA
TOPICAL_CREAM | VAGINAL | Status: DC | PRN
  Administered 2018-05-26: 1 via VAGINAL

## 2018-05-26 MED ORDER — ESTROGENS, CONJUGATED 0.625 MG/GM VA CREA
TOPICAL_CREAM | VAGINAL | Status: AC
  Filled 2018-05-26: qty 30

## 2018-05-26 MED ORDER — ONDANSETRON HCL 4 MG/2ML IJ SOLN
INTRAMUSCULAR | Status: DC | PRN
  Administered 2018-05-26: 4 mg via INTRAVENOUS

## 2018-05-26 MED ORDER — KETOROLAC TROMETHAMINE 30 MG/ML IJ SOLN
INTRAMUSCULAR | Status: AC
  Filled 2018-05-26: qty 1

## 2018-05-26 MED ORDER — FAMOTIDINE 20 MG PO TABS
ORAL_TABLET | ORAL | Status: AC
  Filled 2018-05-26: qty 1

## 2018-05-26 MED ORDER — FAMOTIDINE 20 MG PO TABS
20.0000 mg | ORAL_TABLET | Freq: Once | ORAL | Status: AC
  Administered 2018-05-26: 20 mg via ORAL

## 2018-05-26 MED ORDER — FENTANYL CITRATE (PF) 250 MCG/5ML IJ SOLN
INTRAMUSCULAR | Status: AC
  Filled 2018-05-26: qty 5

## 2018-05-26 MED ORDER — SUCCINYLCHOLINE CHLORIDE 20 MG/ML IJ SOLN
INTRAMUSCULAR | Status: AC
  Filled 2018-05-26: qty 1

## 2018-05-26 MED ORDER — PROPOFOL 10 MG/ML IV BOLUS
INTRAVENOUS | Status: AC
  Filled 2018-05-26: qty 20

## 2018-05-26 MED ORDER — MIDAZOLAM HCL 2 MG/2ML IJ SOLN
INTRAMUSCULAR | Status: DC | PRN
  Administered 2018-05-26: 2 mg via INTRAVENOUS

## 2018-05-26 MED ORDER — SUGAMMADEX SODIUM 200 MG/2ML IV SOLN
INTRAVENOUS | Status: DC | PRN
  Administered 2018-05-26: 150 mg via INTRAVENOUS

## 2018-05-26 MED ORDER — LACTATED RINGERS IV SOLN
INTRAVENOUS | Status: DC
  Administered 2018-05-26: 12:00:00 via INTRAVENOUS

## 2018-05-26 MED ORDER — KETOROLAC TROMETHAMINE 30 MG/ML IJ SOLN
30.0000 mg | Freq: Four times a day (QID) | INTRAMUSCULAR | Status: DC
  Administered 2018-05-26: 30 mg via INTRAVENOUS

## 2018-05-26 MED ORDER — LIDOCAINE-EPINEPHRINE 1 %-1:100000 IJ SOLN
INTRAMUSCULAR | Status: DC | PRN
  Administered 2018-05-26: 15 mL

## 2018-05-26 MED ORDER — OXYCODONE-ACETAMINOPHEN 5-325 MG PO TABS: 1.0000 | ORAL_TABLET | ORAL | 0 refills | Status: DC | PRN

## 2018-05-26 MED ORDER — MORPHINE SULFATE (PF) 4 MG/ML IV SOLN: 1.0000 mg | INTRAVENOUS | Status: DC | PRN

## 2018-05-26 MED ORDER — EPHEDRINE SULFATE 50 MG/ML IJ SOLN
INTRAMUSCULAR | Status: DC | PRN
  Administered 2018-05-26: 7.5 mg via INTRAVENOUS

## 2018-05-26 MED ORDER — ONDANSETRON HCL 4 MG/2ML IJ SOLN: 4.0000 mg | Freq: Once | INTRAMUSCULAR | Status: DC | PRN

## 2018-05-26 MED ORDER — FENTANYL CITRATE (PF) 100 MCG/2ML IJ SOLN
25.0000 ug | INTRAMUSCULAR | Status: AC | PRN
  Administered 2018-05-26 (×2): 25 ug via INTRAVENOUS

## 2018-05-26 MED ORDER — MIDAZOLAM HCL 2 MG/2ML IJ SOLN
INTRAMUSCULAR | Status: AC
  Filled 2018-05-26: qty 2

## 2018-05-26 MED ORDER — SODIUM CHLORIDE 0.9 % IV SOLN
INTRAVENOUS | Status: AC
  Filled 2018-05-26: qty 2

## 2018-05-26 MED ORDER — PROPOFOL 10 MG/ML IV BOLUS
INTRAVENOUS | Status: DC | PRN
  Administered 2018-05-26: 100 mg via INTRAVENOUS

## 2018-05-26 SURGICAL SUPPLY — 51 items
BAG URINE DRAINAGE (UROLOGICAL SUPPLIES) ×4 IMPLANT
BLADE SURG 15 STRL LF DISP TIS (BLADE) ×2 IMPLANT
BLADE SURG 15 STRL SS (BLADE) ×4
BLADE SURG SZ10 CARB STEEL (BLADE) ×4 IMPLANT
CANISTER SUCT 1200ML W/VALVE (MISCELLANEOUS) ×4 IMPLANT
CATH FOLEY 2WAY  5CC 16FR (CATHETERS) ×2
CATH FOLEY 2WAY 5CC 16FR (CATHETERS) ×2
CATH URTH 16FR FL 2W BLN LF (CATHETERS) ×2 IMPLANT
COVER WAND RF STERILE (DRAPES) ×2 IMPLANT
DRAPE PERI LITHO V/GYN (MISCELLANEOUS) ×4 IMPLANT
DRAPE SHEET LG 3/4 BI-LAMINATE (DRAPES) ×6 IMPLANT
DRAPE UNDER BUTTOCK W/FLU (DRAPES) ×4 IMPLANT
ELECT CAUTERY BLADE 6.4 (BLADE) ×4 IMPLANT
ELECT REM PT RETURN 9FT ADLT (ELECTROSURGICAL) ×4
ELECTRODE REM PT RTRN 9FT ADLT (ELECTROSURGICAL) ×2 IMPLANT
GAUZE 4X4 16PLY RFD (DISPOSABLE) ×2 IMPLANT
GAUZE PACK 2X3YD (GAUZE/BANDAGES/DRESSINGS) ×4 IMPLANT
GLOVE BIO SURGEON STRL SZ8 (GLOVE) ×10 IMPLANT
GOWN STRL REUS W/ TWL LRG LVL3 (GOWN DISPOSABLE) ×6 IMPLANT
GOWN STRL REUS W/ TWL XL LVL3 (GOWN DISPOSABLE) ×2 IMPLANT
GOWN STRL REUS W/TWL LRG LVL3 (GOWN DISPOSABLE) ×12
GOWN STRL REUS W/TWL XL LVL3 (GOWN DISPOSABLE) ×4
KIT TURNOVER CYSTO (KITS) ×4 IMPLANT
KIT TURNOVER KIT A (KITS) ×4 IMPLANT
LABEL OR SOLS (LABEL) ×4 IMPLANT
NDL HPO THNWL 1X22GA REG BVL (NEEDLE) ×2 IMPLANT
NDL MAYO CATGUT SZ4 (NEEDLE) ×4 IMPLANT
NDL MAYO CATGUT SZ4 TCR NDL (NEEDLE) ×2 IMPLANT
NDL SPNL 22GX3.5 QUINCKE BK (NEEDLE) ×2 IMPLANT
NEEDLE SAFETY 22GX1 (NEEDLE) ×4
NEEDLE SPNL 22GX3.5 QUINCKE BK (NEEDLE) ×4 IMPLANT
NS IRRIG 500ML POUR BTL (IV SOLUTION) ×4 IMPLANT
PACK BASIN MINOR ARMC (MISCELLANEOUS) ×4 IMPLANT
PAD OB MATERNITY 4.3X12.25 (PERSONAL CARE ITEMS) ×4 IMPLANT
PAD PREP 24X41 OB/GYN DISP (PERSONAL CARE ITEMS) ×4 IMPLANT
SOL PREP PVP 2OZ (MISCELLANEOUS)
SOLUTION PREP PVP 2OZ (MISCELLANEOUS) ×2 IMPLANT
STRAP SAFETY 5IN WIDE (MISCELLANEOUS) ×4 IMPLANT
SUT ETHIBOND NAB CT1 #1 30IN (SUTURE) ×12 IMPLANT
SUT VIC AB 0 CT1 27 (SUTURE) ×8
SUT VIC AB 0 CT1 27XCR 8 STRN (SUTURE) ×4 IMPLANT
SUT VIC AB 0 CT1 36 (SUTURE) ×4 IMPLANT
SUT VIC AB 1 CT1 36 (SUTURE) ×4 IMPLANT
SUT VIC AB 2-0 CT1 (SUTURE) ×8 IMPLANT
SUT VIC AB 2-0 CT1 27 (SUTURE) ×4
SUT VIC AB 2-0 CT1 TAPERPNT 27 (SUTURE) ×2 IMPLANT
SUT VIC AB 3-0 SH 27 (SUTURE) ×4
SUT VIC AB 3-0 SH 27X BRD (SUTURE) ×2 IMPLANT
SYR 10ML LL (SYRINGE) ×4 IMPLANT
SYR BULB IRRIG 60ML STRL (SYRINGE) ×4 IMPLANT
SYR CONTROL 10ML (SYRINGE) ×4 IMPLANT

## 2018-05-26 NOTE — Progress Notes (Signed)
Voided 150 of bloody urine    Bladder scan shows 140

## 2018-05-26 NOTE — Progress Notes (Signed)
Pt. Up to bathroom voided 250 ml , pain 3/10 VSS pt. Discharged home.

## 2018-05-26 NOTE — Anesthesia Postprocedure Evaluation (Signed)
Anesthesia Post Note  Patient: Tonya Murray  Procedure(s) Performed: HYSTERECTOMY VAGINAL (Bilateral ) ANTERIOR AND POSTERIOR REPAIR WITH SACROSPINOUS FIXATION (N/A )  Anesthesia Type: General  Patient did well.  No apparent anesthetic issues.   Last Vitals:  Vitals:   05/26/18 1719 05/26/18 1721  BP: (!) 159/73   Pulse: (!) 55 (!) 55  Resp: 11 16  Temp:    SpO2: 100% 100%    Last Pain:  Vitals:   05/26/18 1721  TempSrc:   PainSc: 5                  Antanette Richwine

## 2018-05-26 NOTE — Anesthesia Procedure Notes (Signed)
Procedure Name: Intubation Date/Time: 05/26/2018 2:58 PM Performed by: Clyde Lundborgisser, Cheron Pasquarelli L, CRNA Pre-anesthesia Checklist: Patient identified, Emergency Drugs available, Suction available and Patient being monitored Patient Re-evaluated:Patient Re-evaluated prior to induction Oxygen Delivery Method: Circle system utilized Preoxygenation: Pre-oxygenation with 100% oxygen Induction Type: IV induction Ventilation: Mask ventilation without difficulty Laryngoscope Size: Glidescope (Glidescope used for training purposes w/ new unit) Grade View: Grade I Tube type: Oral Tube size: 7.0 mm Number of attempts: 1 Airway Equipment and Method: Stylet and Video-laryngoscopy Placement Confirmation: ETT inserted through vocal cords under direct vision,  positive ETCO2,  CO2 detector and breath sounds checked- equal and bilateral Secured at: 21 cm Tube secured with: Tape Dental Injury: Teeth and Oropharynx as per pre-operative assessment

## 2018-05-26 NOTE — Transfer of Care (Signed)
Immediate Anesthesia Transfer of Care Note  Patient: Tonya Murray  Procedure(s) Performed: HYSTERECTOMY VAGINAL (Bilateral ) ANTERIOR AND POSTERIOR REPAIR WITH SACROSPINOUS FIXATION (N/A )  Patient Location: PACU  Anesthesia Type:General  Level of Consciousness: awake, alert  and oriented  Airway & Oxygen Therapy: Patient Spontanous Breathing and Patient connected to face mask oxygen  Post-op Assessment: Report given to RN and Post -op Vital signs reviewed and stable  Post vital signs: Reviewed and stable  Last Vitals:  Vitals Value Taken Time  BP    Temp    Pulse    Resp    SpO2      Last Pain:  Vitals:   05/26/18 1130  TempSrc: Temporal         Complications: No apparent anesthesia complications

## 2018-05-26 NOTE — Anesthesia Preprocedure Evaluation (Signed)
Anesthesia Evaluation  Patient identified by MRN, date of birth, ID band Patient awake    Reviewed: Allergy & Precautions, NPO status , Patient's Chart, lab work & pertinent test results  Airway Mallampati: III       Dental   Pulmonary former smoker,    Pulmonary exam normal        Cardiovascular hypertension, Normal cardiovascular exam     Neuro/Psych CVA negative psych ROS   GI/Hepatic negative GI ROS, Neg liver ROS,   Endo/Other  negative endocrine ROS  Renal/GU negative Renal ROS Bladder dysfunction Female GU complaint     Musculoskeletal negative musculoskeletal ROS (+)   Abdominal Normal abdominal exam  (+)   Peds negative pediatric ROS (+)  Hematology negative hematology ROS (+)   Anesthesia Other Findings Past Medical History: No date: Bladder prolapse, female, acquired No date: Chest pain No date: HTN (hypertension) 2000s: Normal cardiac stress test     Comment:  normal with palps No date: Postmenopausal  Reproductive/Obstetrics                             Anesthesia Physical Anesthesia Plan  ASA: III  Anesthesia Plan: General   Post-op Pain Management:    Induction: Intravenous  PONV Risk Score and Plan:   Airway Management Planned: Oral ETT  Additional Equipment:   Intra-op Plan:   Post-operative Plan: Extubation in OR  Informed Consent: I have reviewed the patients History and Physical, chart, labs and discussed the procedure including the risks, benefits and alternatives for the proposed anesthesia with the patient or authorized representative who has indicated his/her understanding and acceptance.   Dental advisory given  Plan Discussed with: CRNA and Surgeon  Anesthesia Plan Comments:         Anesthesia Quick Evaluation

## 2018-05-26 NOTE — Progress Notes (Signed)
Installed 300cc of saline  Into foley cath   Removed cath  [facility]

## 2018-05-26 NOTE — Progress Notes (Signed)
Removed vaginal packing   Pt states this helped the pressure

## 2018-05-26 NOTE — Anesthesia Post-op Follow-up Note (Signed)
Anesthesia QCDR form completed.        

## 2018-05-26 NOTE — Interval H&P Note (Signed)
History and Physical Interval Note:  05/26/2018 1:54 PM  Tonya Murray  has presented today for surgery, with the diagnosis of cystocele and uterine prolapse  The various methods of treatment have been discussed with the patient and family. After consideration of risks, benefits and other options for treatment, the patient has consented to  Procedure(s): HYSTERECTOMY VAGINAL with BILATERAL SALPINGO OPHORECTOMY (Bilateral) ANTERIOR AND POSTERIOR REPAIR as a surgical intervention .  The patient's history has been reviewed, patient examined, no change in status, stable for surgery.  I have reviewed the patient's chart and labs.  Questions were answered to the patient's satisfaction.     Letitia Libraobert Paul Roniya Tetro

## 2018-05-26 NOTE — Discharge Instructions (Signed)
AMBULATORY SURGERY  DISCHARGE INSTRUCTIONS   1) The drugs that you were given will stay in your system until tomorrow so for the next 24 hours you should not:  A) Drive an automobile B) Make any legal decisions C) Drink any alcoholic beverage   2) You may resume regular meals tomorrow.  Today it is better to start with liquids and gradually work up to solid foods.  You may eat anything you prefer, but it is better to start with liquids, then soup and crackers, and gradually work up to solid foods.   3) Please notify your doctor immediately if you have any unusual bleeding, trouble breathing, redness and pain at the surgery site, drainage, fever, or pain not relieved by medication. 4)   5) Your post-operative visit with Dr.                                     is: Date:                        Time:    Please call to schedule your post-operative visit.  6) Additional Instructions:      Vaginal Hysterectomy, Care After Refer to this sheet in the next few weeks. These instructions provide you with information about caring for yourself after your procedure. Your health care provider may also give you more specific instructions. Your treatment has been planned according to current medical practices, but problems sometimes occur. Call your health care provider if you have any problems or questions after your procedure. What can I expect after the procedure? After the procedure, it is common to have:  Pain.  Soreness and numbness in your incision areas.  Vaginal bleeding and discharge.  Constipation.  Temporary problems emptying the bladder.  Feelings of sadness or other emotions. Follow these instructions at home: Medicines  Take over-the-counter and prescription medicines only as told by your health care provider.  If you were prescribed an antibiotic medicine, take it as told by your health care provider. Do not stop taking the antibiotic even if you start to feel  better.  Do not drive or operate heavy machinery while taking prescription pain medicine. Activity  Return to your normal activities as told by your health care provider. Ask your health care provider what activities are safe for you.  Get regular exercise as told by your health care provider. You may be told to take short walks every day and go farther each time.  Do not lift anything that is heavier than 10 lb (4.5 kg). General instructions   Do not put anything in your vagina for 6 weeks after your surgery or as told by your health care provider. This includes tampons and douches.  Do not have sex until your health care provider says you can.  Do not take baths, swim, or use a hot tub until your health care provider approves.  Drink enough fluid to keep your urine clear or pale yellow.  Do not drive for 24 hours if you were given a sedative.  Keep all follow-up visits as told by your health care provider. This is important. Contact a health care provider if:  Your pain medicine is not helping.  You have a fever.  You have redness, swelling, or pain at your incision site.  You have blood, pus, or a bad-smelling discharge from your vagina.  You continue  to have difficulty urinating. Get help right away if:  You have severe abdominal or back pain.  You have heavy bleeding from your vagina.  You have chest pain or shortness of breath. This information is not intended to replace advice given to you by your health care provider. Make sure you discuss any questions you have with your health care provider. Document Released: 09/16/2015 Document Revised: 10/31/2015 Document Reviewed: 06/09/2015 Elsevier Interactive Patient Education  2019 [employer]

## 2018-05-26 NOTE — Op Note (Signed)
Preoperative diagnosis: Pelvic organ prolapse, Cystocele  Postoperative diagnosis: Same  Procedure: Transvaginal hysterectomy, Anterior colporrhaphy  Surgeon: Annamarie MajorPaul Khallid Pasillas, M.D.  Anesthesia: general  Findings: Uterine prolapse and cystocele.  Estimated blood loss: 10 cc  Specimen: Uterus to pathology   Disposition: Tolerated procedure well  Procedure: Patient was taken to the OR where she was placed in dorsal lithotomy in Allen stirrups. She was prepped and draped in the usual sterile fashion. A timeout was performed. Foley is placed into bladder. A speculum was placed inside the vagina. The cervix was visualized and grasped with a tenaculum. 1% lidocaine with epinephrine were injected paracervically. A bovie was used to make a circumferential incision around the vagina. An opened sponge was used to dissect the vagina off the cervix. The posterior peritoneum was entered sharply with Mayo scissors.  The anterior peritoneal cavity was entered sharply with careful dissection of the bladder off the underlying cervix. A Heaney clamp was used to clamp first the left uterosacral ligament and cardinal which was then cut and Haney suture ligated with 0 Vicryl stitch, the stitch was held and later attached to the vaginal mucosa. Similarly the right uterosacral ligament was clamped cut and suture ligated.  Sequential clamping, transecting and suture ligating up the broad to the uterine arteries were taken until the tubo-ovarian pedicles were encountered. The uterus was then amputated.  The peritoneum was then closed with a running pursestring suture of 0 Vicryl. The uterosacrals were plicated using a 1-0 Ethibond suture.  Vagina is irrigated.  Anterior colporrhaphy is performed.  Allis clamps are placed along the anterior vaginal wall, lidocaine is used to infiltrate the plane, and incision is made midline vertical.  Endopelvic fascia is dissected free of vaginal mucosa.  Fascia is plicated w interrupted  vicryl sutures.  Excess mucosa is excised.  Vaginal incision is closed with a running locking Vicryl suture, to incoprate the hysterectomy incision as well, with care taken to incorporate the uterosacral pedicles. Excellent hemostasis was noted at the end of the case. The vaginal cuff was inspected there was minimal bleeding noted.  Packing gauze w Premarin cream is placed. A Foley catheter is left in  place inside her bladder. Clear, yellow urine was noted. All instrument needle and lap counts were correct x 2. Patient was awakened taken to recovery room in stable condition.  Letitia Libraobert Paul Tynan Boesel, MD 05/26/2018, 4:15 PM

## 2018-05-27 ENCOUNTER — Encounter: Payer: Self-pay | Admitting: Obstetrics & Gynecology

## 2018-05-30 ENCOUNTER — Encounter (INDEPENDENT_AMBULATORY_CARE_PROVIDER_SITE_OTHER): Payer: BLUE CROSS/BLUE SHIELD | Admitting: Vascular Surgery

## 2018-05-30 LAB — SURGICAL PATHOLOGY

## 2018-05-30 NOTE — Telephone Encounter (Signed)
Per the referral order note, Melissa faxed notes on 05/18/18.  [facility] 05/[facility]/18 [facility] and patient stated she wanted to call them back.

## 2018-05-30 NOTE — Telephone Encounter (Signed)
LVM with Dr. Daisy BlossomPotter's nurse informing them that patient's carotid ultrasound and ct of head results have been faxed to their office and that those results are also available via care everywhere in Epic.  And if they have any issues to please contact our office.

## 2018-05-30 NOTE — Telephone Encounter (Signed)
Yes, I know she sent info on the 11th We got NEW information, study results on the 12th I need to make sure Tonya Murray has those; that's all I just want it documented that they got her carotids and head imaging so they are aware of the blockage and the stroke; that information was not available on the 11th

## 2018-06-02 ENCOUNTER — Ambulatory Visit (INDEPENDENT_AMBULATORY_CARE_PROVIDER_SITE_OTHER): Payer: BLUE CROSS/BLUE SHIELD | Admitting: Vascular Surgery

## 2018-06-02 ENCOUNTER — Encounter (INDEPENDENT_AMBULATORY_CARE_PROVIDER_SITE_OTHER): Payer: Self-pay | Admitting: Vascular Surgery

## 2018-06-02 VITALS — BP 145/82 | HR 59 | Resp 14 | Ht 67.0 in | Wt 153.8 lb

## 2018-06-02 DIAGNOSIS — Z8673 Personal history of transient ischemic attack (TIA), and cerebral infarction without residual deficits: Secondary | ICD-10-CM

## 2018-06-02 DIAGNOSIS — I10 Essential (primary) hypertension: Secondary | ICD-10-CM | POA: Diagnosis not present

## 2018-06-02 DIAGNOSIS — Z7982 Long term (current) use of aspirin: Secondary | ICD-10-CM

## 2018-06-02 DIAGNOSIS — I6523 Occlusion and stenosis of bilateral carotid arteries: Secondary | ICD-10-CM | POA: Diagnosis not present

## 2018-06-02 NOTE — Progress Notes (Signed)
MRN : [id number]  Tonya Murray is a 62 y.o. ([birth year]) female who presents with chief complaint of  Chief Complaint  Patient presents with  . New Patient (Initial Visit)  .  History of Present Illness:   The patient is seen for evaluation of carotid stenosis. The carotid stenosis was identified after duplex ultrasound was obtained.  The patient denies amaurosis fugax. There is no recent history of TIA symptoms or focal motor deficits. 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  There is no history of migraine headaches. There is no history of seizures.  The patient is taking enteric-coated aspirin 325 mg daily.  The patient has a history of coronary artery disease, no recent episodes of angina or shortness of breath. The patient denies PAD or claudication symptoms. There is a history of hyperlipidemia which is being treated with a statin.    Current Meds  Medication Sig  . aspirin 325 MG EC tablet Take 325 mg by mouth daily.  Marland Kitchen. oxyCODONE-acetaminophen (PERCOCET/ROXICET) 5-325 MG tablet Take 1 tablet by mouth every 4 (four) hours as needed for moderate pain.  . rosuvastatin (CRESTOR) 10 MG tablet Take one pill by mouth twice a week    Past Medical History:  Diagnosis Date  . Bladder prolapse, female, acquired   . Chest pain   . HTN (hypertension)   . Normal cardiac stress test 2000s   normal with palps  . Postmenopausal     Past Surgical History:  Procedure Laterality Date  . ANTERIOR AND POSTERIOR REPAIR WITH SACROSPINOUS FIXATION N/A 05/26/2018   Procedure: ANTERIOR AND POSTERIOR REPAIR WITH SACROSPINOUS FIXATION;  Surgeon: Nadara MustardHarris, Robert P, MD;  Location: [facility] ORS;  Service: Gynecology;  Laterality: N/A;  . DILATION AND CURETTAGE OF UTERUS     after one of her deliveries   . TONSILLECTOMY    . TRACHEOSTOMY CLOSURE  1980  . VAGINAL HYSTERECTOMY Bilateral 05/26/2018   Procedure: HYSTERECTOMY VAGINAL;  Surgeon: Nadara MustardHarris, Robert P, MD;  Location: [facility] ORS;   Service: Gynecology;  Laterality: Bilateral;    Social History Social History   Tobacco Use  . Smoking status: Former Smoker    Last attempt to quit: 05/13/1976    Years since quitting: 42.0  . Smokeless tobacco: Never Used  Substance Use Topics  . Alcohol use: No  . Drug use: No    Family History Family History  Problem Relation Age of Onset  . Arrhythmia Mother   . Diabetes Father   . Cholelithiasis Father   . Diabetes Sister   . Cervical cancer Maternal Grandmother   . Bladder Cancer Neg Hx   . Kidney cancer Neg Hx     Allergies  Allergen Reactions  . Dilantin [Phenytoin Sodium Extended] Rash     REVIEW OF SYSTEMS (Negative unless checked)  Constitutional: [] Weight loss  [] Fever  [] Chills Cardiac: [] Chest pain   [] Chest pressure   [] Palpitations   [] Shortness of breath when laying flat   [] Shortness of breath with exertion. Vascular:  [] Pain in legs with walking   [] Pain in legs at rest  [] History of DVT   [] Phlebitis   [] Swelling in legs   [] Varicose veins   [] Non-healing ulcers Pulmonary:   [] Uses home oxygen   [] Productive cough   [] Hemoptysis   [] Wheeze  [] COPD   [] Asthma Neurologic:  [] Dizziness   [] Seizures   [x] History of stroke   [] History of TIA  [] Aphasia   [] Vissual changes   [] Weakness or numbness in arm   []   Weakness or numbness in leg Musculoskeletal:   [] Joint swelling   [] Joint pain   [] Low back pain Hematologic:  [] Easy bruising  [] Easy bleeding   [] Hypercoagulable state   [] Anemic Gastrointestinal:  [] Diarrhea   [] Vomiting  [] Gastroesophageal reflux/heartburn   [] Difficulty swallowing. Genitourinary:  [] Chronic kidney disease   [] Difficult urination  [] Frequent urination   [] Blood in urine Skin:  [] Rashes   [] Ulcers  Psychological:  [] History of anxiety   []  History of major depression.  Physical Examination  Vitals:   06/02/18 1122  BP: (!) 145/82  Pulse: (!) 59  Resp: 14  Weight: 153 lb 12.8 oz (69.8 kg)  Height: 5\' 7"  (1.702 m)   Body  mass index is 24.09 kg/m. Gen: WD/WN, NAD Head: Laird/AT, No temporalis wasting.  Ear/Nose/Throat: Hearing grossly intact, nares w/o erythema or drainage Eyes: PER, EOMI, sclera nonicteric.  Neck: Supple, no large masses.   Pulmonary:  Good air movement, no audible wheezing bilaterally, no use of accessory muscles.  Cardiac: RRR, no JVD Vascular:  No carotid bruits Vessel Right Left  Radial Palpable Palpable  Brachial Palpable Palpable  Carotid Palpable Palpable  Gastrointestinal: Non-distended. No guarding/no peritoneal signs.  Musculoskeletal: M/S 5/5 throughout.  No deformity or atrophy.  Neurologic: CN 2-12 intact. Symmetrical.  Speech is fluent. Motor exam as listed above. Psychiatric: Judgment intact, Mood & affect appropriate for pt's clinical situation. Dermatologic: No rashes or ulcers noted.  No changes consistent with cellulitis. Lymph : No lichenification or skin changes of chronic lymphedema.  CBC Lab Results  Component Value Date   WBC 6.0 05/13/2018   HGB 12.3 05/13/2018   HCT 38.7 05/13/2018   MCV 88.4 05/13/2018   PLT 326 05/13/2018    BMET    Component Value Date/Time   NA 138 05/13/2018 1119   K 4.0 05/13/2018 1119   CL 107 05/13/2018 1119   CO2 25 05/13/2018 1119   GLUCOSE 97 05/13/2018 1119   BUN 17 05/13/2018 1119   CREATININE 0.82 05/13/2018 1119   CREATININE 0.82 05/11/2018 1052   CALCIUM 8.8 (L) 05/13/2018 1119   GFRNONAA >60 05/13/2018 1119   GFRNONAA 77 05/11/2018 1052   GFRAA >60 05/13/2018 1119   GFRAA 90 05/11/2018 1052   Estimated Creatinine Clearance: 70.1 mL/min (by C-G formula based on SCr of 0.82 mg/dL).  COAG Lab Results  Component Value Date   INR 0.90 05/13/2018    Radiology Ct Head Wo Contrast  Result Date: 05/19/2018 CLINICAL DATA:  Hypertension with recent right-sided facial droop EXAM: CT HEAD WITHOUT CONTRAST TECHNIQUE: Contiguous axial images were obtained from the base of the skull through the vertex without  intravenous contrast. COMPARISON:  None. FINDINGS: Brain: There is moderate cerebellar atrophy, more prominent on the left than on the right. The ventricles are normal in size and configuration. The supratentorial sulci appear within normal limits. There is no evident intracranial mass hemorrhage, extra-axial fluid collection, or midline shift. There is patchy small vessel disease in the centra semiovale bilaterally. There is a focal age uncertain infarct in the anterior right centrum semiovale immediately adjacent to the superior right lateral ventricle anteriorly. No other findings suggesting potentially recent infarct evident on this study. Vascular: There is no appreciable hyperdense vessel. There is calcification in each carotid siphon as well as to a lesser extent in the distal right vertebral artery. Skull: The bony calvarium appears intact. Sinuses/Orbits: There is mucosal thickening in several ethmoid air cells. Other visualized paranasal sinuses are clear. Visualized orbits appear symmetric  bilaterally. Other: Mastoid air cells are clear. IMPRESSION: Moderate cerebellar atrophy, more pronounced on the left than on the right. Age uncertain focal infarct in the anterior right centrum semiovale. Elsewhere there is patchy periventricular small vessel disease. No mass or hemorrhage. There are foci of arterial vascular calcification. There is mucosal thickening in several ethmoid air cells. Electronically Signed   By: Bretta BangWilliam  Woodruff III M.D.   On: 05/19/2018 15:10   [place] Carotid Duplex Bilateral  Result Date: 05/19/2018 CLINICAL DATA:  Facial droop for the past year. History of hypertension and hyperlipidemia. Former smoker. EXAM: BILATERAL CAROTID DUPLEX ULTRASOUND TECHNIQUE: Wallace CullensGray scale imaging, color Doppler and duplex ultrasound were performed of bilateral carotid and vertebral arteries in the neck. COMPARISON:  None. FINDINGS: Criteria: Quantification of carotid stenosis is based on velocity parameters  that correlate the residual internal carotid diameter with NASCET-based stenosis levels, using the diameter of the distal internal carotid lumen as the denominator for stenosis measurement. The following velocity measurements were obtained: RIGHT ICA:  86/21 cm/sec CCA:  80/22 cm/sec SYSTOLIC ICA/CCA RATIO:  1.1 ECA:  123 cm/sec LEFT ICA:  104/31 cm/sec CCA:  98/33 cm/sec SYSTOLIC ICA/CCA RATIO:  1.1 ECA:  86 cm/sec RIGHT CAROTID ARTERY: There is a minimal amount of eccentric mixed echogenic plaque within the right carotid bulb (image 22). There is a minimal amount of eccentric echogenic partially shadowing plaque involving the origin and proximal aspects the right internal carotid artery (image 32), not resulting in elevated peak systolic velocities within the interrogated course of the right internal carotid artery to suggest a hemodynamically significant stenosis. RIGHT VERTEBRAL ARTERY:  Antegrade Flow LEFT CAROTID ARTERY: There is a moderate amount of eccentric hypoechoic plaque involving the mid (image 48 and distal (image 52) aspects of the left common carotid artery. There is a moderate to large amount of eccentric hypoechoic plaque involving the left carotid bulb (image 57), extending to involve the origin and proximal aspects of the left internal carotid artery (image 65), not resulting in elevated peak systolic velocities within the interrogated course of the left internal carotid artery to suggest a hemodynamically significant stenosis. Borderline elevated peak systolic velocity within distal aspect of the left internal carotid artery is felt to be factitiously elevated due to sampling at a location of turbulent flow. LEFT VERTEBRAL ARTERY:  Antegrade Flow IMPRESSION: Minimal to moderate amount of bilateral atherosclerotic plaque, left greater than right, not definitely resulting in a hemodynamically significant stenosis within either internal carotid artery. Electronically Signed   By: Simonne ComeJohn  Watts M.D.    On: 05/19/2018 16:59     Assessment/Plan 1. Carotid atherosclerosis, bilateral Recommend:  Given the patient's asymptomatic subcritical stenosis no further invasive testing or surgery at this time.  Duplex ultrasound shows <30% stenosis bilaterally.  Continue antiplatelet therapy as prescribed Continue management of CAD, HTN and Hyperlipidemia Healthy heart diet,  encouraged exercise at least 4 times per week  Follow up in 12 months with duplex ultrasound and physical exam   - VAS US CAROTID; Future  2. HYPERTENSION, BENIGN Continue antihypertensive medications as already ordered, these medications have been reviewed and there are no changes at this time.   3. Hx of completed stroke See #1   Levora DredgeGregory Larrissa Stivers, MD  06/02/2018 12:27 PM

## 2018-06-02 NOTE — Progress Notes (Signed)
N

## 2018-06-06 ENCOUNTER — Encounter: Payer: Self-pay | Admitting: Obstetrics & Gynecology

## 2018-06-06 ENCOUNTER — Ambulatory Visit (INDEPENDENT_AMBULATORY_CARE_PROVIDER_SITE_OTHER): Payer: BLUE CROSS/BLUE SHIELD | Admitting: Obstetrics & Gynecology

## 2018-06-06 VITALS — BP 150/80 | Ht 66.0 in | Wt 155.0 lb

## 2018-06-06 DIAGNOSIS — N812 Incomplete uterovaginal prolapse: Secondary | ICD-10-CM

## 2018-06-06 DIAGNOSIS — N8111 Cystocele, midline: Secondary | ICD-10-CM

## 2018-06-06 NOTE — Progress Notes (Signed)
  Postoperative Follow-up Patient presents post op from vaginal hysterectomy and anterior colporrhaphy for pelvic relaxation, 2 weeks ago. Pathology: DIAGNOSIS:  A. UTERUS WITH CERVIX; HYSTERECTOMY:  - CERVIX WITH SQUAMOUS ATROPHY.  - ATROPHIC ENDOMETRIUM AND MYOMETRIUM (47 GRAM UTERUS). Subjective: Patient reports marked improvement in her preop symptoms. Fatigue and soreness initially.  Eating a regular diet without difficulty. The patient is not having any pain.  Activity: sedentary. Patient reports additional symptom's since surgery of None.  Objective: BP (!) 150/80   Ht 5\' 6"  (1.676 m)   Wt 155 lb (70.3 kg)   BMI 25.02 kg/m  Physical Exam Constitutional:      General: She is not in acute distress.    Appearance: She is well-developed.  Musculoskeletal: Normal range of motion.  Neurological:     Mental Status: She is alert and oriented to person, place, and time.  Skin:    General: Skin is warm and dry.  Vitals signs reviewed.   Assessment: s/p :  vaginal hysterectomy and anterior colporrhaphy progressing well  Plan: Patient has done well after surgery with no apparent complications.  I have discussed the post-operative course to date, and the expected progress moving forward.  The patient understands what complications to be concerned about.  I will see the patient in routine follow up, or sooner if needed.    Activity plan: No heavy lifting.Marland Kitchen.  Pelvic rest.  MMG scheduled in Jan 2020  Tonya LibraRobert Paul Quintus Premo 06/06/2018, 10:20 AM

## 2018-06-10 ENCOUNTER — Encounter: Payer: Self-pay | Admitting: Family Medicine

## 2018-06-10 ENCOUNTER — Ambulatory Visit (INDEPENDENT_AMBULATORY_CARE_PROVIDER_SITE_OTHER): Payer: BLUE CROSS/BLUE SHIELD | Admitting: Family Medicine

## 2018-06-10 VITALS — BP 128/66 | HR 82 | Temp 97.9°F | Ht 67.0 in | Wt 154.1 lb

## 2018-06-10 DIAGNOSIS — I6523 Occlusion and stenosis of bilateral carotid arteries: Secondary | ICD-10-CM

## 2018-06-10 DIAGNOSIS — G319 Degenerative disease of nervous system, unspecified: Secondary | ICD-10-CM | POA: Diagnosis not present

## 2018-06-10 DIAGNOSIS — Z8673 Personal history of transient ischemic attack (TIA), and cerebral infarction without residual deficits: Secondary | ICD-10-CM

## 2018-06-10 DIAGNOSIS — E785 Hyperlipidemia, unspecified: Secondary | ICD-10-CM | POA: Insufficient documentation

## 2018-06-10 DIAGNOSIS — I10 Essential (primary) hypertension: Secondary | ICD-10-CM | POA: Diagnosis not present

## 2018-06-10 NOTE — Progress Notes (Signed)
BP 128/66   Pulse 82   Temp 97.9 F (36.6 C) (Oral)   Ht 5\' 7"  (1.702 m)   Wt 154 lb 1.6 oz (69.9 kg)   SpO2 99%   BMI 24.14 kg/m    Subjective:    Patient ID: Tonya Murray, female    DOB: 02-02-1956, 63 y.o.   MRN: [id number]  HPI: Tonya Murray is a 63 y.o. female  Chief Complaint  Patient presents with  . Follow-up    HPI Patient is here for f/u She was seen one month ago  Since the last visit, she has seen the vascular doctor, Dr. Lorretta Harp; reviewed his note; carotid stenosis; no bruits on his exam; nothing new to do and next f/u in one year; continued aspirin and the statin; he encouraged exercise 4 times a week or more; she walks her dog weather permitting   She also saw Dr. Velora Mediate, GYN; she had a hysterectomy, day surgery; no issues with bleeding; muscles feels a little weak in there; she has some urinary urgency; no urine odor, clear urine; she declined me checking urine; no fevers; no leg edema  She would like to see a neurologist in [place]; new referral being entered by CMA; abnormal brain imaging; hx of stroke; on aspirin with no bleeding; mother has had several strokes, but is doing well at [facility] cholesterol; last lipids reviewed; tolerating the crestor well two days a week; likes cheese  HTN; well-controlled today; not adding salt  Depression screen [facility] 2/9 06/10/2018 05/11/2018  Decreased Interest 0 0  Down, Depressed, Hopeless 0 0  PHQ - 2 Score 0 0  Altered sleeping 0 0  Tired, decreased energy 0 0  Change in appetite 0 0  Feeling bad or failure about yourself  0 0  Trouble concentrating 0 0  Moving slowly or fidgety/restless 0 0  Suicidal thoughts 0 0  PHQ-9 Score 0 0  Difficult doing work/chores Not difficult at all Not difficult at all   Fall Risk  06/10/2018 05/11/2018  Falls in the past year? 0 0  Number falls in past yr: 0 -  Injury with Fall? 0 -    Relevant past medical, surgical, family and social history reviewed Past  Medical History:  Diagnosis Date  . Bladder prolapse, female, acquired   . Chest pain   . HTN (hypertension)   . Normal cardiac stress test 2000s   normal with palps  . Postmenopausal    Past Surgical History:  Procedure Laterality Date  . ANTERIOR AND POSTERIOR REPAIR WITH SACROSPINOUS FIXATION N/A 05/26/2018   Procedure: ANTERIOR AND POSTERIOR REPAIR WITH SACROSPINOUS FIXATION;  Surgeon: Nadara Mustard, MD;  Location: [facility] ORS;  Service: Gynecology;  Laterality: N/A;  . DILATION AND CURETTAGE OF UTERUS     after one of her deliveries   . TONSILLECTOMY    . TRACHEOSTOMY CLOSURE  1980  . VAGINAL HYSTERECTOMY Bilateral 05/26/2018   Procedure: HYSTERECTOMY VAGINAL;  Surgeon: Nadara Mustard, MD;  Location: [facility] ORS;  Service: Gynecology;  Laterality: Bilateral;   Family History  Problem Relation Age of Onset  . Arrhythmia Mother   . Diabetes Father   . Cholelithiasis Father   . Diabetes Sister   . Cervical cancer Maternal Grandmother   . Bladder Cancer Neg Hx   . Kidney cancer Neg Hx    Social History   Tobacco Use  . Smoking status: Former Smoker    Last attempt to quit: 05/13/1976  Years since quitting: 42.1  . Smokeless tobacco: Never Used  Substance Use Topics  . Alcohol use: No  . Drug use: No     Office Visit from 06/10/2018 in [facility]  AUDIT-C Score  0      Interim medical history since last visit reviewed. Allergies and medications reviewed  Review of Systems Per HPI unless specifically indicated above     Objective:    BP 128/66   Pulse 82   Temp 97.9 F (36.6 C) (Oral)   Ht 5\' 7"  (1.702 m)   Wt 154 lb 1.6 oz (69.9 kg)   SpO2 99%   BMI 24.14 kg/m   Wt Readings from Last 3 Encounters:  06/10/18 154 lb 1.6 oz (69.9 kg)  06/06/18 155 lb (70.3 kg)  06/02/18 153 lb 12.8 oz (69.8 kg)    Physical Exam Constitutional:      General: She is not in acute distress.    Appearance: She is well-developed. She is not  diaphoretic.  HENT:     Head: Normocephalic and atraumatic.  Eyes:     General: No scleral icterus. Neck:     Thyroid: No thyromegaly.  Cardiovascular:     Rate and Rhythm: Normal rate and regular rhythm.     Heart sounds: Normal heart sounds. No murmur.  Pulmonary:     Effort: Pulmonary effort is normal. No respiratory distress.     Breath sounds: Normal breath sounds. No wheezing.  Abdominal:     General: There is no distension.  Skin:    General: Skin is warm and dry.     Coloration: Skin is not pale.  Neurological:     Mental Status: She is alert.  Psychiatric:        Behavior: Behavior normal.        Thought Content: Thought content normal.        Judgment: Judgment normal.     Results for orders placed or performed during the hospital encounter of 05/26/18  ABO/Rh  Result Value Ref Range   ABO/RH(D)      O POS Performed at [facility], [address] [place], [place] [address]   Surgical pathology  Result Value Ref Range   SURGICAL PATHOLOGY      Surgical Pathology CASE: [id number] PATIENT: [facility] Surgical Pathology Report     SPECIMEN SUBMITTED: A. Uterus and cervix  CLINICAL HISTORY: None provided  PRE-OPERATIVE DIAGNOSIS: Cystocele and uterine prolapse  POST-OPERATIVE DIAGNOSIS: Same as pre-op     DIAGNOSIS: A. UTERUS WITH CERVIX; HYSTERECTOMY: - CERVIX WITH SQUAMOUS ATROPHY. - ATROPHIC ENDOMETRIUM AND MYOMETRIUM (47 GRAM UTERUS).   GROSS DESCRIPTION: A. Labeled: Uterus, cervix Received: In formalin Weight: 47 grams Dimensions:      Fundus -3.5 x 3.4 x 2.5 cm      Cervix -3.4 x 4.0 cm with a 1.0 cm external os Serosa: Slightly wrinkled purple-tan Cervix: Smooth tan with focal ecchymosis Endocervix: Trabecular pink-tan Endometrial cavity:      Dimensions -1.5 x 0.5 cm      Thickness -0.1 cm      Other findings -none noted Myometrium:     Thickness -1.6 cm     Other findings -none noted  Block summary: 1 -  representative anterior cervix 2 - representative posterior cervi x 3 - representative anterior endomyometrium 4 - representative posterior endomyometrium   Final Diagnosis performed by Ronald Lobo, MD.   Electronically signed 05/30/2018 8:11:46PM The electronic signature indicates that the  named Attending Pathologist has evaluated the specimen  Technical component performed at [facility], [address], [place], [place] [address] Lab: [phone] Dir: Jolene SchimkeSanjai Nagendra, MD, MMM  Professional component performed at [facility], [facility], [address], [place], [place] [address] Lab: [phone] Dir: Georgiann Cockerara C. Oneita Krasubinas, MD       Assessment & Plan:   Problem List Items Addressed This Visit      Cardiovascular and Mediastinum   HYPERTENSION, BENIGN (Chronic)    In diagnosis list from 2012; well-controlled today; limit salt intake      Carotid atherosclerosis, bilateral - Primary (Chronic)    Seen by vascular; will be due for f/u in one year; "remind me" note to appear Sept 1, 2020 to put in new referral to [place]asc [place] in time for December 2020 appointment; continue statin and aspirin; no bruits on exam today; get that LDL down        Nervous and Auditory   Cerebellar atrophy (HCC) (Chronic)    [place]      Relevant Orders   Ambulatory referral to Neurology     Other   Hyperlipidemia LDL goal <70    Continue crestor two days a week, Wed and Saturday; recheck 6 weeks after started medicine; limit egg yolks, cheese, saturated fats in animal products; goal LDL less than 70      Hx of completed stroke    Continue 325 mg aspirin; control lipids; patient walking, trying to exercise; referred to neurologist      Relevant Orders   Ambulatory referral to Neurology       Follow up plan: No follow-ups on file.  An after-visit summary was printed and given to the patient at check-out.  Please see the patient instructions  which may contain other information and recommendations beyond what is mentioned above in the assessment and plan.  No orders of the defined types were placed in this encounter.   Orders Placed This Encounter  Procedures  . Ambulatory referral to Neurology

## 2018-06-10 NOTE — Assessment & Plan Note (Signed)
Seen by vascular; will be due for f/u in one year; "remind me" note to appear Sept 1, 2020 to put in new referral to vasc specialist in [place] in time for December 2020 appointment; continue statin and aspirin; no bruits on exam today; get that LDL down

## 2018-06-10 NOTE — Assessment & Plan Note (Signed)
Continue 325 mg aspirin; control lipids; patient walking, trying to exercise; referred to neurologist

## 2018-06-10 NOTE — Assessment & Plan Note (Signed)
In diagnosis list from 2012; well-controlled today; limit salt intake

## 2018-06-10 NOTE — Patient Instructions (Addendum)
Try to limit saturated fats in your diet (bologna, hot dogs, barbeque, cheeseburgers, hamburgers, steak, bacon, sausage, cheese, etc.) and get more fresh fruits, vegetables, and whole grains  Return for cholesterol check on or after July 01, 2018

## 2018-06-10 NOTE — Assessment & Plan Note (Signed)
Continue crestor two days a week, Wed and Saturday; recheck 6 weeks after started medicine; limit egg yolks, cheese, saturated fats in animal products; goal LDL less than 70

## 2018-06-10 NOTE — Assessment & Plan Note (Signed)
Refer to neurologist in [place]

## 2018-06-15 ENCOUNTER — Ambulatory Visit
Admission: RE | Admit: 2018-06-15 | Discharge: 2018-06-15 | Disposition: A | Discharge status: Home / Self Care | Payer: BLUE CROSS/BLUE SHIELD | Source: Ambulatory Visit | Attending: Obstetrics & Gynecology | Admitting: Obstetrics & Gynecology

## 2018-06-15 DIAGNOSIS — Z1239 Encounter for other screening for malignant neoplasm of breast: Secondary | ICD-10-CM | POA: Diagnosis present

## 2018-06-16 ENCOUNTER — Encounter: Payer: Self-pay | Admitting: Obstetrics & Gynecology

## 2018-06-20 ENCOUNTER — Ambulatory Visit: Payer: BLUE CROSS/BLUE SHIELD | Admitting: Neurology

## 2018-06-21 ENCOUNTER — Ambulatory Visit: Payer: BLUE CROSS/BLUE SHIELD | Admitting: Neurology

## 2018-06-21 ENCOUNTER — Telehealth: Payer: Self-pay | Admitting: Neurology

## 2018-06-21 ENCOUNTER — Encounter: Payer: Self-pay | Admitting: Neurology

## 2018-06-21 VITALS — BP 149/77 | HR 60 | Ht 66.0 in | Wt 158.0 lb

## 2018-06-21 DIAGNOSIS — G459 Transient cerebral ischemic attack, unspecified: Secondary | ICD-10-CM

## 2018-06-21 DIAGNOSIS — Z87820 Personal history of traumatic brain injury: Secondary | ICD-10-CM

## 2018-06-21 HISTORY — DX: Personal history of traumatic brain injury: Z87.820
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
## 2018-06-21 NOTE — Telephone Encounter (Signed)
[employer] Auth: [id number] (exp. 06/21/18 to 07/20/18) patient is scheduled at [facility] for 06/27/18 arrival time is 3:30 pm patient is aware of time & day and if she needs to r/s their number is [phone].

## 2018-06-27 ENCOUNTER — Ambulatory Visit: Payer: BLUE CROSS/BLUE SHIELD

## 2018-07-04 ENCOUNTER — Encounter: Payer: Self-pay | Admitting: Obstetrics & Gynecology

## 2018-07-04 ENCOUNTER — Ambulatory Visit (INDEPENDENT_AMBULATORY_CARE_PROVIDER_SITE_OTHER): Payer: BLUE CROSS/BLUE SHIELD | Admitting: Obstetrics & Gynecology

## 2018-07-04 VITALS — BP 150/80 | Ht 66.0 in | Wt 157.0 lb

## 2018-07-04 DIAGNOSIS — Z90710 Acquired absence of both cervix and uterus: Secondary | ICD-10-CM | POA: Insufficient documentation

## 2018-07-04 DIAGNOSIS — Z9889 Other specified postprocedural states: Secondary | ICD-10-CM

## 2018-07-04 NOTE — Progress Notes (Signed)
  Postoperative Follow-up Patient presents post op from vaginal hysterectomy for pelvic relaxation, 6 weeks ago.  Subjective: Patient reports fatigue and pelvic cramping at times.  No bleeding.  Min urinary complaints w occas freq and urge.  Objective: BP (!) 150/80   Ht 5\' 6"  (1.676 m)   Wt 157 lb (71.2 kg)   BMI 25.34 kg/m  Physical Exam Constitutional:      General: She is not in acute distress.    Appearance: She is well-developed.  Genitourinary:     Pelvic exam was performed with patient supine.     Vagina and rectum normal.     No vaginal erythema or bleeding.     No right or left adnexal mass present.     Right adnexa not tender.     Left adnexa not tender.     Genitourinary Comments: Cervix and uterus absent. Vaginal cuff healing well w small area of contact spotting on q tip test  Cardiovascular:     Rate and Rhythm: Normal rate.  Pulmonary:     Effort: Pulmonary effort is normal.  Abdominal:     General: There is no distension.     Palpations: Abdomen is soft.     Tenderness: There is no abdominal tenderness.     Comments: NT, ND  Musculoskeletal: Normal range of motion.  Neurological:     Mental Status: She is alert and oriented to person, place, and time.     Cranial Nerves: No cranial nerve deficit.  Skin:    General: Skin is warm and dry.   Assessment: s/p :  vaginal hysterectomy stable  Plan: Patient has done well after surgery with no apparent complications.  I have discussed the post-operative course to date, and the expected progress moving forward.  The patient understands what complications to be concerned about.  I will see the patient in routine follow up, or sooner if needed.    Activity plan: No restriction.  Pelvic rest 2 more weeks.  Monitor for pain or bleeding w activity or w sex.  Tonya Murray 07/04/2018, 4:34 PM

## 2018-07-07 ENCOUNTER — Ambulatory Visit: Payer: BLUE CROSS/BLUE SHIELD

## 2018-07-15 ENCOUNTER — Ambulatory Visit: Payer: BLUE CROSS/BLUE SHIELD

## 2018-07-22 ENCOUNTER — Telehealth: Payer: Self-pay | Admitting: Family Medicine

## 2018-07-22 NOTE — Telephone Encounter (Signed)
Left detailed voicemail

## 2018-07-22 NOTE — Telephone Encounter (Signed)
Patient has care gap for colon cancer screening and an unresolved Cologuard order Please contact patient, urge them to complete the Cologuard kit; offer to re-order if needed Thank you

## 2018-08-10 NOTE — Telephone Encounter (Signed)
Patient r/s her MRI for 08/11/18 at [facility]. Updated BCBS Auth: [id number] (exp. 08/10/18 to 09/08/18).

## 2018-08-11 ENCOUNTER — Ambulatory Visit: Payer: BLUE CROSS/BLUE SHIELD

## 2018-08-18 ENCOUNTER — Ambulatory Visit: Payer: BLUE CROSS/BLUE SHIELD

## 2018-08-22 ENCOUNTER — Telehealth: Payer: Self-pay | Admitting: Neurology

## 2018-08-22 ENCOUNTER — Other Ambulatory Visit: Payer: Self-pay

## 2018-08-22 ENCOUNTER — Ambulatory Visit
Admission: RE | Admit: 2018-08-22 | Discharge: 2018-08-22 | Disposition: A | Discharge status: Home / Self Care | Payer: BLUE CROSS/BLUE SHIELD | Source: Ambulatory Visit | Attending: Neurology | Admitting: Neurology

## 2018-08-22 DIAGNOSIS — G459 Transient cerebral ischemic attack, unspecified: Secondary | ICD-10-CM

## 2018-08-22 NOTE — Telephone Encounter (Signed)
The patient has had a prior closed head injury, she has a least a moderate degree of small vessel disease type changes, most prominent in the right frontal area.  The 2D echocardiogram is pending.   MRI brain 08/22/18:  IMPRESSION: 1. No acute intracranial abnormality. 2. Moderate chronic small vessel ischemic disease. 3. Left cerebellar encephalomalacia.
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
## 2018-09-05 NOTE — Telephone Encounter (Signed)
Pt is wanting a call back with the MRI results. Pt states she was having phone problems so an update was made on the phone numbers with a working number.

## 2018-12-26 ENCOUNTER — Other Ambulatory Visit: Payer: Self-pay

## 2018-12-26 DIAGNOSIS — I6523 Occlusion and stenosis of bilateral carotid arteries: Secondary | ICD-10-CM

## 2018-12-26 MED ORDER — ROSUVASTATIN CALCIUM 10 MG PO TABS: ORAL_TABLET | ORAL | 1 refills | Status: DC

## 2018-12-26 NOTE — Addendum Note (Signed)
Addended by: Docia Furl on: 12/26/2018 03:07 PM   Modules accepted: Orders

## 2018-12-26 NOTE — Telephone Encounter (Signed)
This was routed to Dr. Delight Ovens basket with no documentation.  What is needed?

## 2019-03-03 IMAGING — MG DIGITAL SCREENING BILATERAL MAMMOGRAM WITH TOMO AND CAD
8 series · 8 of 24 positions shown · non-contrast
Comparison: Previous exam(s).

CLINICAL DATA: Screening.

EXAM:
DIGITAL SCREENING BILATERAL MAMMOGRAM WITH TOMO AND CAD

[L CC synth-2D]
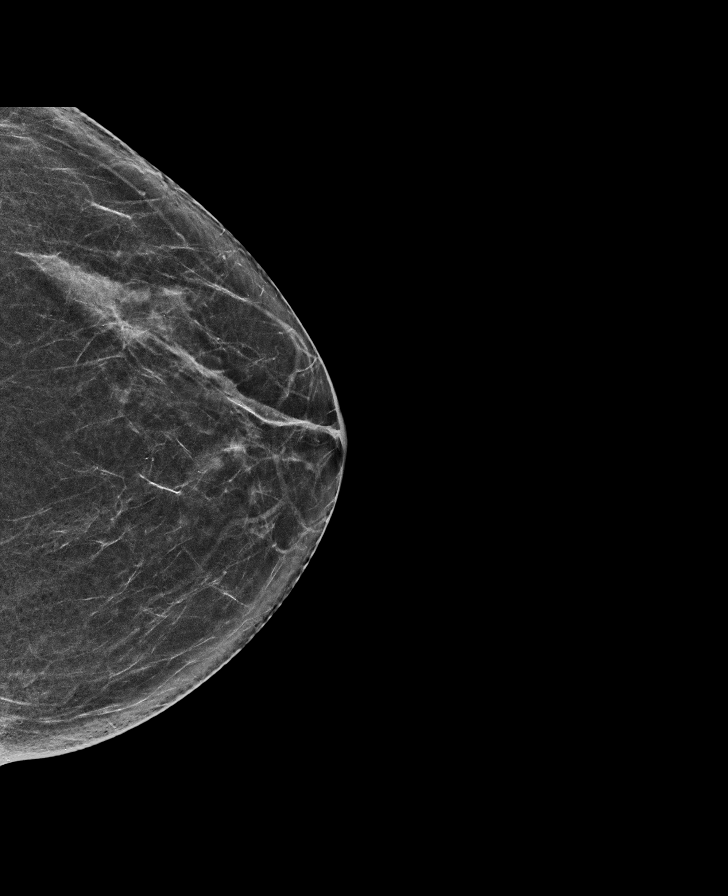

[L MLO synth-2D]
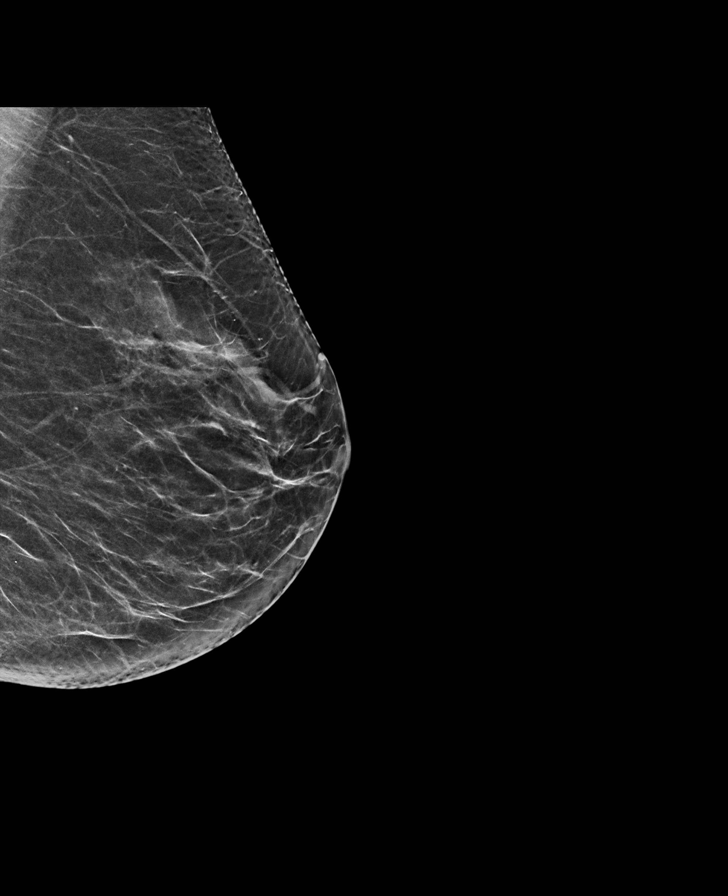

[R CC synth-2D]
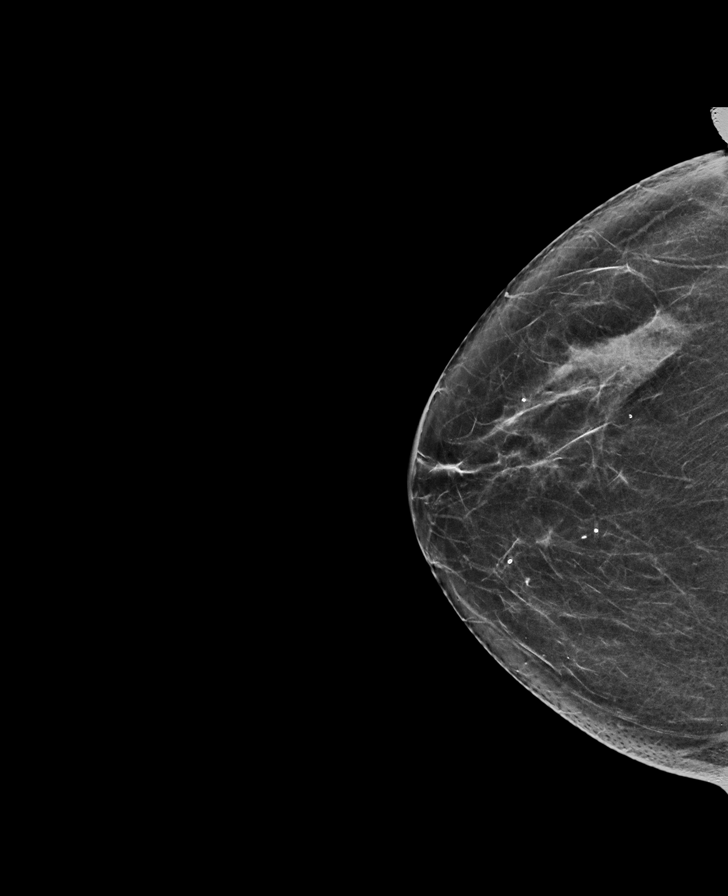

[R MLO synth-2D]
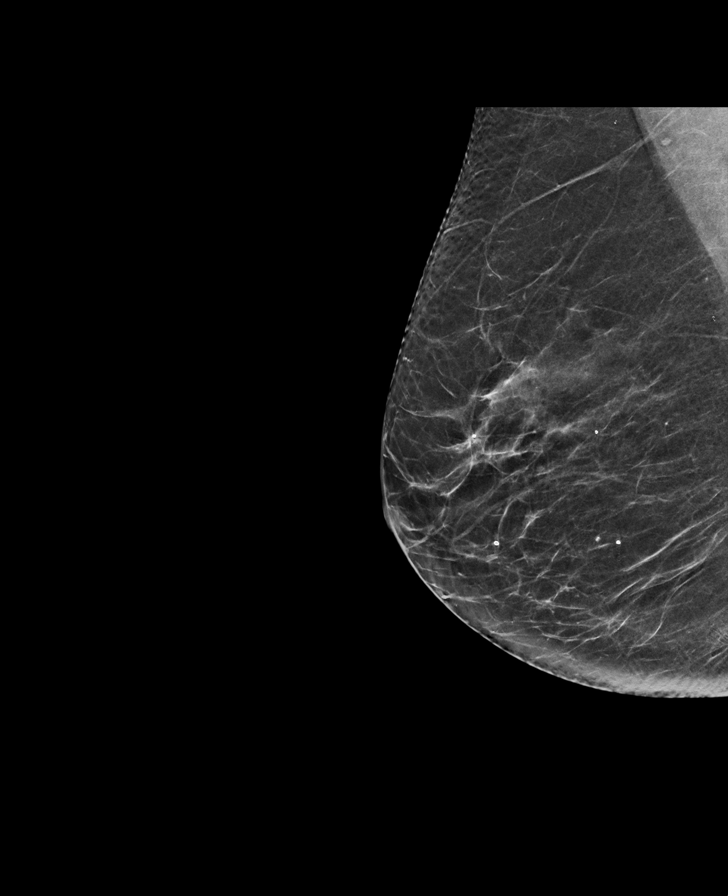

[R MLO tomo · tomo slice 33/66.0]
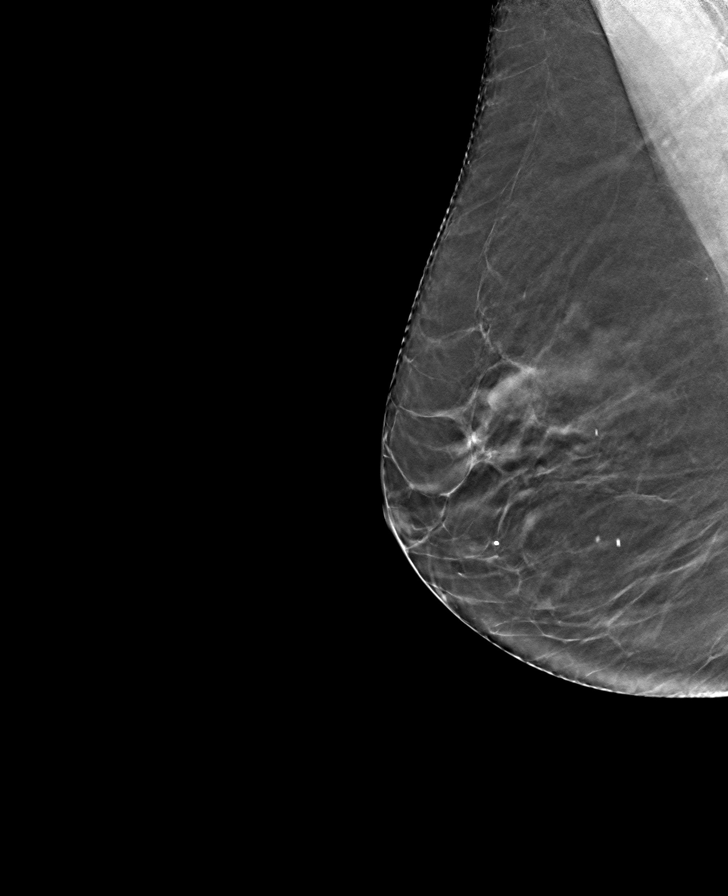

[R CC tomo · tomo slice 34/67.0]
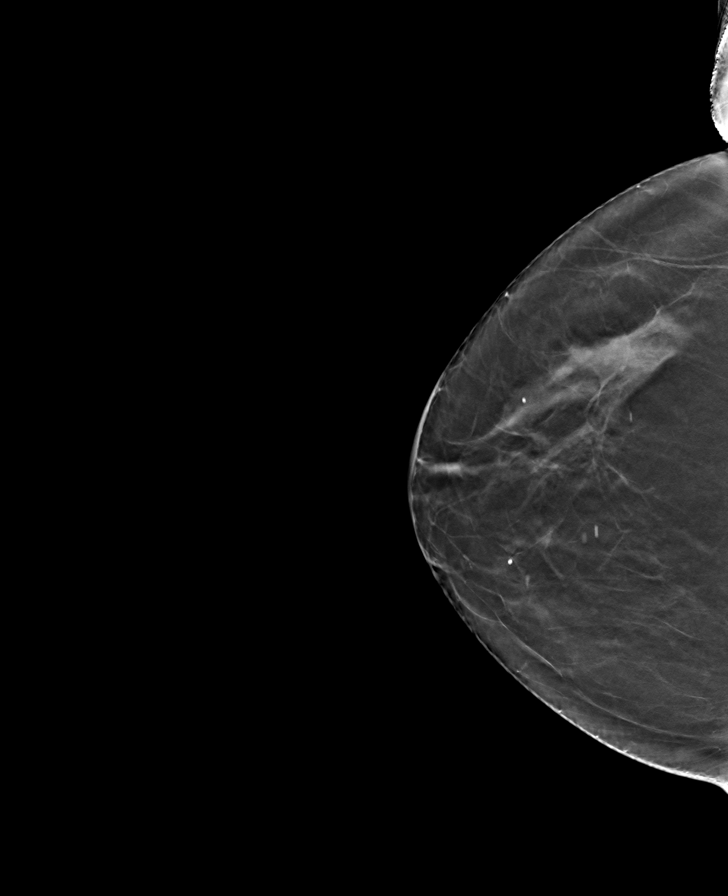

[L CC tomo · tomo slice 33/65.0]
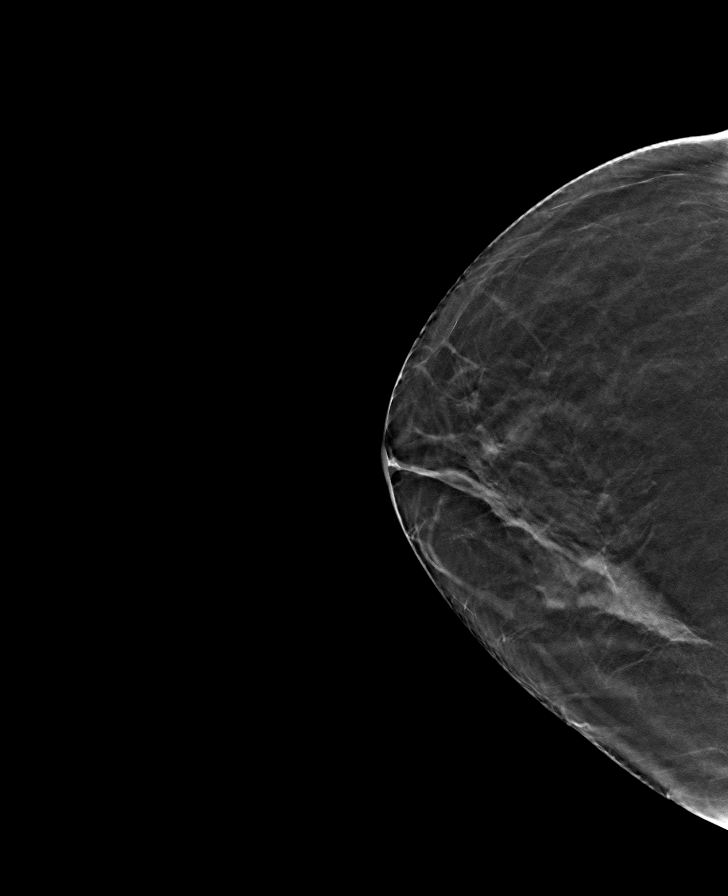

[L MLO tomo · tomo slice 33/65.0]
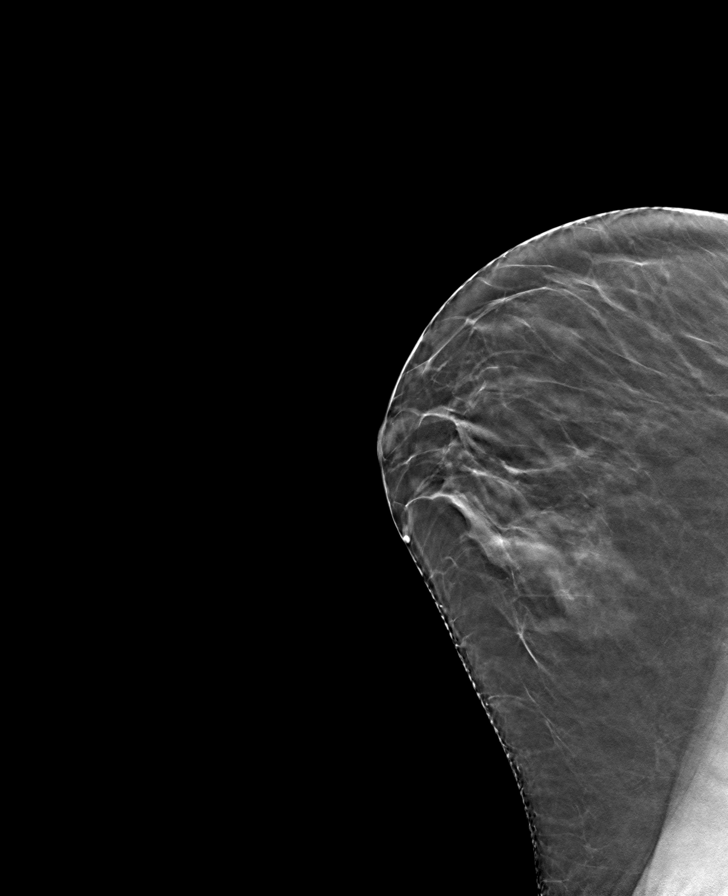

[8 of 24 positions shown; findings below may reference images not displayed]

ACR Breast Density Category b: There are scattered areas of
fibroglandular density.
FINDINGS: There are no findings suspicious for malignancy. Images were
processed with CAD.
IMPRESSION: No mammographic evidence of malignancy. A result letter of this
screening mammogram will be mailed directly to the patient.

RECOMMENDATION:
Screening mammogram in one year. (Code:[id number]-775)

BI-RADS CATEGORY  1: Negative.

## 2019-06-05 ENCOUNTER — Ambulatory Visit (INDEPENDENT_AMBULATORY_CARE_PROVIDER_SITE_OTHER): Payer: BLUE CROSS/BLUE SHIELD | Admitting: Vascular Surgery

## 2019-06-05 ENCOUNTER — Encounter (INDEPENDENT_AMBULATORY_CARE_PROVIDER_SITE_OTHER): Payer: BLUE CROSS/BLUE SHIELD

## 2019-07-03 ENCOUNTER — Ambulatory Visit (INDEPENDENT_AMBULATORY_CARE_PROVIDER_SITE_OTHER): Payer: BLUE CROSS/BLUE SHIELD | Admitting: Vascular Surgery

## 2019-07-03 ENCOUNTER — Other Ambulatory Visit: Payer: Self-pay

## 2019-07-03 ENCOUNTER — Encounter (INDEPENDENT_AMBULATORY_CARE_PROVIDER_SITE_OTHER): Payer: Self-pay | Admitting: Vascular Surgery

## 2019-07-03 ENCOUNTER — Ambulatory Visit (INDEPENDENT_AMBULATORY_CARE_PROVIDER_SITE_OTHER): Payer: BLUE CROSS/BLUE SHIELD

## 2019-07-03 ENCOUNTER — Encounter (INDEPENDENT_AMBULATORY_CARE_PROVIDER_SITE_OTHER): Payer: Self-pay

## 2019-07-03 VITALS — BP 188/91 | HR 61 | Resp 16 | Wt 162.0 lb

## 2019-07-03 DIAGNOSIS — I10 Essential (primary) hypertension: Secondary | ICD-10-CM

## 2019-07-03 DIAGNOSIS — I6523 Occlusion and stenosis of bilateral carotid arteries: Secondary | ICD-10-CM | POA: Diagnosis not present

## 2019-07-03 DIAGNOSIS — E785 Hyperlipidemia, unspecified: Secondary | ICD-10-CM

## 2019-07-03 NOTE — Progress Notes (Signed)
MRN : [id number]  Tonya Murray is a 64 y.o. (19-Jan-1956) female who presents with chief complaint of  Chief Complaint  Patient presents with  . Follow-up    ultrasound follow up  .  History of Present Illness:   The patient is seen for follow up evaluation of carotid stenosis. The carotid stenosis followed by ultrasound.   The patient denies amaurosis fugax. There is no recent history of TIA symptoms or focal motor deficits. There is no prior documented CVA.  The patient is taking enteric-coated aspirin 81 mg daily.  There is no history of migraine headaches. There is no history of seizures.  The patient has a history of coronary artery disease, no recent episodes of angina or shortness of breath. The patient denies PAD or claudication symptoms. There is a history of hyperlipidemia which is being treated with a statin.    Carotid Duplex done today shows <40%.  No change compared to last study in 05/19/2018  Current Meds  Medication Sig  . aspirin 325 MG EC tablet Take 325 mg by mouth daily.  . rosuvastatin (CRESTOR) 10 MG tablet Take one pill by mouth twice a week    Past Medical History:  Diagnosis Date  . Bladder prolapse, female, acquired   . Chest pain   . History of closed head injury 06/21/2018  . HTN (hypertension)   . Normal cardiac stress test 2000s   normal with palps  . Postmenopausal     Past Surgical History:  Procedure Laterality Date  . ANTERIOR AND POSTERIOR REPAIR WITH SACROSPINOUS FIXATION N/A 05/26/2018   Procedure: ANTERIOR AND POSTERIOR REPAIR WITH SACROSPINOUS FIXATION;  Surgeon: Gae Dry, MD;  Location: [facility] ORS;  Service: Gynecology;  Laterality: N/A;  . DILATION AND CURETTAGE OF UTERUS     after one of her deliveries   . TONSILLECTOMY    . TRACHEOSTOMY CLOSURE  1980  . VAGINAL HYSTERECTOMY Bilateral 05/26/2018   Procedure: HYSTERECTOMY VAGINAL;  Surgeon: Gae Dry, MD;  Location: [facility] ORS;  Service: Gynecology;  Laterality:  Bilateral;    Social History Social History   Tobacco Use  . Smoking status: Former Smoker    Types: Cigarettes    Quit date: 05/13/1976    Years since quitting: 43.1  . Smokeless tobacco: Never Used  Substance Use Topics  . Alcohol use: No  . Drug use: Not Currently    Comment: prescribed    Family History Family History  Problem Relation Age of Onset  . Arrhythmia Mother   . Diabetes Father   . Cholelithiasis Father   . Diabetes Sister   . Cervical cancer Maternal Grandmother   . Bladder Cancer Neg Hx   . Kidney cancer Neg Hx   . Breast cancer Neg Hx     Allergies  Allergen Reactions  . Dilantin [Phenytoin Sodium Extended] Rash     REVIEW OF SYSTEMS (Negative unless checked)  Constitutional: [] Weight loss  [] Fever  [] Chills Cardiac: [] Chest pain   [] Chest pressure   [] Palpitations   [] Shortness of breath when laying flat   [] Shortness of breath with exertion. Vascular:  [] Pain in legs with walking   [] Pain in legs at rest  [] History of DVT   [] Phlebitis   [] Swelling in legs   [] Varicose veins   [] Non-healing ulcers Pulmonary:   [] Uses home oxygen   [] Productive cough   [] Hemoptysis   [] Wheeze  [] COPD   [] Asthma Neurologic:  [] Dizziness   [] Seizures   [] History of stroke   []   History of TIA  [] Aphasia   [] Vissual changes   [] Weakness or numbness in arm   [] Weakness or numbness in leg Musculoskeletal:   [] Joint swelling   [] Joint pain   [] Low back pain Hematologic:  [] Easy bruising  [] Easy bleeding   [] Hypercoagulable state   [] Anemic Gastrointestinal:  [] Diarrhea   [] Vomiting  [] Gastroesophageal reflux/heartburn   [] Difficulty swallowing. Genitourinary:  [] Chronic kidney disease   [] Difficult urination  [] Frequent urination   [] Blood in urine Skin:  [] Rashes   [] Ulcers  Psychological:  [] History of anxiety   []  History of major depression.  Physical Examination  Vitals:   07/03/19 1035  BP: (!) 188/91  Pulse: 61  Resp: 16  Weight: 162 lb (73.5 kg)   Body  mass index is 26.15 kg/m. Gen: WD/WN, NAD Head: [place]/AT, No temporalis wasting.  Ear/Nose/Throat: Hearing grossly intact, nares w/o erythema or drainage Eyes: PER, EOMI, sclera nonicteric.  Neck: Supple, no large masses.   Pulmonary:  Good air movement, no audible wheezing bilaterally, no use of accessory muscles.  Cardiac: RRR, no JVD Vascular:  Vessel Right Left  Radial Palpable Palpable  Brachial Palpable Palpable  Carotid Palpable Palpable  Gastrointestinal: Non-distended. No guarding/no peritoneal signs.  Musculoskeletal: M/S 5/5 throughout.  No deformity or atrophy.  Neurologic: CN 2-12 intact. Symmetrical.  Speech is fluent. Motor exam as listed above. Psychiatric: Judgment intact, Mood & affect appropriate for pt's clinical situation. Dermatologic: No rashes or ulcers noted.  No changes consistent with cellulitis. Lymph : No lichenification or skin changes of chronic lymphedema.  CBC Lab Results  Component Value Date   WBC 6.0 05/13/2018   HGB 12.3 05/13/2018   HCT 38.7 05/13/2018   MCV 88.4 05/13/2018   PLT 326 05/13/2018    BMET    Component Value Date/Time   NA 138 05/13/2018 1119   K 4.0 05/13/2018 1119   CL 107 05/13/2018 1119   CO2 25 05/13/2018 1119   GLUCOSE 97 05/13/2018 1119   BUN 17 05/13/2018 1119   CREATININE 0.82 05/13/2018 1119   CREATININE 0.82 05/11/2018 1052   CALCIUM 8.8 (L) 05/13/2018 1119   GFRNONAA >60 05/13/2018 1119   GFRNONAA 77 05/11/2018 1052   GFRAA >60 05/13/2018 1119   GFRAA 90 05/11/2018 1052   CrCl cannot be calculated (Patient's most recent lab result is older than the maximum 21 days allowed.).  COAG Lab Results  Component Value Date   INR 0.90 05/13/2018    Radiology No results found.    Assessment/Plan 1. Carotid atherosclerosis, bilateral Recommend:  Given the patient's asymptomatic subcritical stenosis no further invasive testing or surgery at this time.  Duplex ultrasound shows <40% stenosis  bilaterally.  Continue antiplatelet therapy as prescribed Continue management of CAD, HTN and Hyperlipidemia Healthy heart diet,  encouraged exercise at least 4 times per week Follow up in 12 months with duplex ultrasound and physical exam.   - VAS CAROTID; Future  2. HYPERTENSION, BENIGN Continue antihypertensive medications as already ordered, these medications have been reviewed and there are no changes at this time.   3. Hyperlipidemia LDL goal <70 Continue statin as ordered and reviewed, no changes at this time     , MD  07/03/2019 10:40 AM

## 2019-07-06 ENCOUNTER — Encounter (INDEPENDENT_AMBULATORY_CARE_PROVIDER_SITE_OTHER): Payer: Self-pay | Admitting: Vascular Surgery

## 2020-03-05 ENCOUNTER — Ambulatory Visit: Payer: BLUE CROSS/BLUE SHIELD | Attending: Oncology

## 2020-03-06 ENCOUNTER — Telehealth: Payer: Self-pay | Admitting: *Deleted

## 2020-07-01 ENCOUNTER — Other Ambulatory Visit: Payer: Self-pay

## 2020-07-01 ENCOUNTER — Encounter (INDEPENDENT_AMBULATORY_CARE_PROVIDER_SITE_OTHER): Payer: Self-pay | Admitting: Vascular Surgery

## 2020-07-01 ENCOUNTER — Ambulatory Visit (INDEPENDENT_AMBULATORY_CARE_PROVIDER_SITE_OTHER): Payer: BLUE CROSS/BLUE SHIELD | Admitting: Vascular Surgery

## 2020-07-01 ENCOUNTER — Ambulatory Visit (INDEPENDENT_AMBULATORY_CARE_PROVIDER_SITE_OTHER): Payer: BLUE CROSS/BLUE SHIELD

## 2020-07-01 VITALS — BP 168/94 | HR 67 | Ht 68.0 in | Wt 162.0 lb

## 2020-07-01 DIAGNOSIS — I6523 Occlusion and stenosis of bilateral carotid arteries: Secondary | ICD-10-CM

## 2020-07-01 DIAGNOSIS — E785 Hyperlipidemia, unspecified: Secondary | ICD-10-CM

## 2020-07-01 DIAGNOSIS — I10 Essential (primary) hypertension: Secondary | ICD-10-CM | POA: Diagnosis not present

## 2020-07-01 NOTE — Progress Notes (Signed)
MRN : [id number]  Tonya Murray is a 65 y.o. ([birth year]) female who presents with chief complaint of No chief complaint on file. Marland Kitchen  History of Present Illness:   The patient is seen for follow up evaluation of carotid stenosis. The carotid stenosis followed by ultrasound.   The patient denies amaurosis fugax. There is no recent history of TIA symptoms or focal motor deficits. There is no prior documented CVA.  The patient is taking enteric-coated aspirin 81 mg daily.  She did not tolerate statin therapy.  There is no history of migraine headaches. There is no history of seizures.  The patient has a history of coronary artery disease, no recent episodes of angina or shortness of breath. The patient denies PAD or claudication symptoms. There is a history of hyperlipidemia which is being treated with a statin.   Carotid Duplex done today shows <30%.  No change compared to last study in 07/03/2019  No outpatient medications have been marked as taking for the 07/01/20 encounter (Appointment) with Gilda Crease, Latina Craver, MD.    Past Medical History:  Diagnosis Date  . Bladder prolapse, female, acquired   . Chest pain   . History of closed head injury 06/21/2018  . HTN (hypertension)   . Normal cardiac stress test 2000s   normal with palps  . Postmenopausal     Past Surgical History:  Procedure Laterality Date  . ANTERIOR AND POSTERIOR REPAIR WITH SACROSPINOUS FIXATION N/A 05/26/2018   Procedure: ANTERIOR AND POSTERIOR REPAIR WITH SACROSPINOUS FIXATION;  Surgeon: Nadara Mustard, MD;  Location: [facility] ORS;  Service: Gynecology;  Laterality: N/A;  . DILATION AND CURETTAGE OF UTERUS     after one of her deliveries   . TONSILLECTOMY    . TRACHEOSTOMY CLOSURE  1980  . VAGINAL HYSTERECTOMY Bilateral 05/26/2018   Procedure: HYSTERECTOMY VAGINAL;  Surgeon: Nadara Mustard, MD;  Location: [facility] ORS;  Service: Gynecology;  Laterality: Bilateral;    Social History Social History    Tobacco Use  . Smoking status: Former Smoker    Types: Cigarettes    Quit date: 05/13/1976    Years since quitting: 44.1  . Smokeless tobacco: Never Used  Vaping Use  . Vaping Use: Never used  Substance Use Topics  . Alcohol use: No  . Drug use: Not Currently    Comment: prescribed    Family History Family History  Problem Relation Age of Onset  . Arrhythmia Mother   . Diabetes Father   . Cholelithiasis Father   . Diabetes Sister   . Cervical cancer Maternal Grandmother   . Bladder Cancer Neg Hx   . Kidney cancer Neg Hx   . Breast cancer Neg Hx     Allergies  Allergen Reactions  . Dilantin [Phenytoin Sodium Extended] Rash     REVIEW OF SYSTEMS (Negative unless checked)  Constitutional: [] Weight loss  [] Fever  [] Chills Cardiac: [] Chest pain   [] Chest pressure   [] Palpitations   [] Shortness of breath when laying flat   [] Shortness of breath with exertion. Vascular:  [] Pain in legs with walking   [] Pain in legs at rest  [] History of DVT   [] Phlebitis   [] Swelling in legs   [] Varicose veins   [] Non-healing ulcers Pulmonary:   [] Uses home oxygen   [] Productive cough   [] Hemoptysis   [] Wheeze  [] COPD   [] Asthma Neurologic:  [] Dizziness   [] Seizures   [] History of stroke   [] History of TIA  [] Aphasia   [] Vissual changes   []   Weakness or numbness in arm   [] Weakness or numbness in leg Musculoskeletal:   [] Joint swelling   [] Joint pain   [] Low back pain Hematologic:  [] Easy bruising  [] Easy bleeding   [] Hypercoagulable state   [] Anemic Gastrointestinal:  [] Diarrhea   [] Vomiting  [] Gastroesophageal reflux/heartburn   [] Difficulty swallowing. Genitourinary:  [] Chronic kidney disease   [] Difficult urination  [] Frequent urination   [] Blood in urine Skin:  [] Rashes   [] Ulcers  Psychological:  [] History of anxiety   []  History of major depression.  Physical Examination  There were no vitals filed for this visit. There is no height or weight on file to calculate BMI. Gen: WD/WN,  NAD Head: [place]/AT, No temporalis wasting.  Ear/Nose/Throat: Hearing grossly intact, nares w/o erythema or drainage Eyes: PER, EOMI, sclera nonicteric.  Neck: Supple, no large masses.   Pulmonary:  Good air movement, no audible wheezing bilaterally, no use of accessory muscles.  Cardiac: RRR, no JVD Vascular:  Vessel Right Left  Radial Palpable Palpable  Carotid Palpable Palpable  Gastrointestinal: Non-distended. No guarding/no peritoneal signs.  Musculoskeletal: M/S 5/5 throughout.  No deformity or atrophy.  Neurologic: CN 2-12 intact. Symmetrical.  Speech is fluent. Motor exam as listed above. Psychiatric: Judgment intact, Mood & affect appropriate for pt's clinical situation. Dermatologic: No rashes or ulcers noted.  No changes consistent with cellulitis.   CBC Lab Results  Component Value Date   WBC 6.0 05/13/2018   HGB 12.3 05/13/2018   HCT 38.7 05/13/2018   MCV 88.4 05/13/2018   PLT 326 05/13/2018    BMET    Component Value Date/Time   NA 138 05/13/2018 1119   K 4.0 05/13/2018 1119   CL 107 05/13/2018 1119   CO2 25 05/13/2018 1119   GLUCOSE 97 05/13/2018 1119   BUN 17 05/13/2018 1119   CREATININE 0.82 05/13/2018 1119   CREATININE 0.82 05/11/2018 1052   CALCIUM 8.8 (L) 05/13/2018 1119   GFRNONAA >60 05/13/2018 1119   GFRNONAA 77 05/11/2018 1052   GFRAA >60 05/13/2018 1119   GFRAA 90 05/11/2018 1052   CrCl cannot be calculated (Patient's most recent lab result is older than the maximum 21 days allowed.).  COAG Lab Results  Component Value Date   INR 0.90 05/13/2018    Radiology No results found.   Assessment/Plan 1. Carotid atherosclerosis, bilateral Recommend:  Given the patient's asymptomatic subcritical stenosis no further invasive testing or surgery at this time.  Duplex ultrasound shows <40% stenosis bilaterally.  Continue antiplatelet therapy as prescribed Continue management of CAD, HTN and Hyperlipidemia Healthy heart diet,  encouraged  exercise at least 4 times per week Follow up in 24 months with duplex ultrasound and physical exam. - VAS CAROTID; Future  2. HYPERTENSION, BENIGN Continue antihypertensive medications as already ordered, these medications have been reviewed and there are no changes at this time.   3. Hyperlipidemia LDL goal <70 Continue statin as ordered and reviewed, no changes at this time     14/11/2017, MD  07/01/2020 10:54 AM

## 2021-09-19 DIAGNOSIS — H6063 Unspecified chronic otitis externa, bilateral: Secondary | ICD-10-CM | POA: Diagnosis not present

## 2021-09-19 DIAGNOSIS — H6123 Impacted cerumen, bilateral: Secondary | ICD-10-CM | POA: Diagnosis not present

## 2021-11-24 ENCOUNTER — Other Ambulatory Visit: Payer: Self-pay | Admitting: Internal Medicine

## 2021-11-24 DIAGNOSIS — I6523 Occlusion and stenosis of bilateral carotid arteries: Secondary | ICD-10-CM

## 2021-11-24 DIAGNOSIS — Z8673 Personal history of transient ischemic attack (TIA), and cerebral infarction without residual deficits: Secondary | ICD-10-CM

## 2021-11-24 DIAGNOSIS — I10 Essential (primary) hypertension: Secondary | ICD-10-CM

## 2021-11-25 ENCOUNTER — Encounter: Payer: Self-pay | Admitting: Internal Medicine

## 2021-11-25 ENCOUNTER — Ambulatory Visit (INDEPENDENT_AMBULATORY_CARE_PROVIDER_SITE_OTHER): Payer: Medicare Other | Admitting: Internal Medicine

## 2021-11-25 VITALS — BP 154/102 | HR 67 | Ht 66.0 in | Wt 160.0 lb

## 2021-11-25 DIAGNOSIS — R1313 Dysphagia, pharyngeal phase: Secondary | ICD-10-CM

## 2021-11-25 DIAGNOSIS — I10 Essential (primary) hypertension: Secondary | ICD-10-CM

## 2021-11-25 DIAGNOSIS — K219 Gastro-esophageal reflux disease without esophagitis: Secondary | ICD-10-CM

## 2021-11-25 DIAGNOSIS — Z1231 Encounter for screening mammogram for malignant neoplasm of breast: Secondary | ICD-10-CM | POA: Diagnosis not present

## 2021-11-25 DIAGNOSIS — Z1159 Encounter for screening for other viral diseases: Secondary | ICD-10-CM

## 2021-11-25 DIAGNOSIS — Z1211 Encounter for screening for malignant neoplasm of colon: Secondary | ICD-10-CM

## 2021-11-25 DIAGNOSIS — Z23 Encounter for immunization: Secondary | ICD-10-CM | POA: Diagnosis not present

## 2021-11-25 DIAGNOSIS — E785 Hyperlipidemia, unspecified: Secondary | ICD-10-CM

## 2021-11-25 MED ORDER — LOSARTAN POTASSIUM 50 MG PO TABS: 50.0000 mg | ORAL_TABLET | Freq: Every day | ORAL | 1 refills | Status: DC
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
## 2021-11-26 ENCOUNTER — Encounter: Payer: Self-pay | Admitting: Internal Medicine

## 2021-11-26 LAB — COMPREHENSIVE METABOLIC PANEL
ALT: 15 IU/L (ref 0–32)
AST: 20 IU/L (ref 0–40)
Albumin/Globulin Ratio: 1.7 (ref 1.2–2.2)
Albumin: 4.7 g/dL (ref 3.8–4.8)
Alkaline Phosphatase: 107 IU/L (ref 44–121)
BUN/Creatinine Ratio: 19 (ref 12–28)
BUN: 18 mg/dL (ref 8–27)
Bilirubin Total: 0.2 mg/dL (ref 0.0–1.2)
CO2: 22 mmol/L (ref 20–29)
Calcium: 10 mg/dL (ref 8.7–10.3)
Chloride: 101 mmol/L (ref 96–106)
Creatinine, Ser: 0.96 mg/dL (ref 0.57–1.00)
Globulin, Total: 2.7 g/dL (ref 1.5–4.5)
Glucose: 105 mg/dL — ABNORMAL HIGH (ref 70–99)
Potassium: 4.8 mmol/L (ref 3.5–5.2)
Sodium: 139 mmol/L (ref 134–144)
Total Protein: 7.4 g/dL (ref 6.0–8.5)
eGFR: 66 mL/min/{1.73_m2} (ref >59)

## 2021-11-26 LAB — CBC WITH DIFFERENTIAL/PLATELET
Basophils Absolute: 0.1 10*3/uL (ref 0.0–0.2)
Basos: 1 %
EOS (ABSOLUTE): 0.1 10*3/uL (ref 0.0–0.4)
Eos: 1 %
Hematocrit: 39.3 % (ref 34.0–46.6)
Hemoglobin: 13.6 g/dL (ref 11.1–15.9)
Immature Grans (Abs): 0 10*3/uL (ref 0.0–0.1)
Immature Granulocytes: 0 %
Lymphocytes Absolute: 2.8 10*3/uL (ref 0.7–3.1)
Lymphs: 38 %
MCH: 29.8 pg (ref 26.6–33.0)
MCHC: 34.6 g/dL (ref 31.5–35.7)
MCV: 86 fL (ref 79–97)
Monocytes Absolute: 0.6 10*3/uL (ref 0.1–0.9)
Monocytes: 8 %
Neutrophils Absolute: 3.8 10*3/uL (ref 1.4–7.0)
Neutrophils: 52 %
Platelets: 320 10*3/uL (ref 150–450)
RBC: 4.57 x10E6/uL (ref 3.77–5.28)
RDW: 13.1 % (ref 11.7–15.4)
WBC: 7.3 10*3/uL (ref 3.4–10.8)

## 2021-11-26 LAB — TSH: TSH: 2.6 u[IU]/mL (ref 0.450–4.500)

## 2021-11-26 LAB — HEPATITIS C ANTIBODY: Hep C Virus Ab: NONREACTIVE

## 2021-12-23 ENCOUNTER — Ambulatory Visit
Admission: RE | Admit: 2021-12-23 | Discharge: 2021-12-23 | Disposition: A | Discharge status: Home / Self Care | Payer: Medicare Other | Source: Ambulatory Visit | Attending: Internal Medicine | Admitting: Internal Medicine

## 2021-12-23 DIAGNOSIS — Z1231 Encounter for screening mammogram for malignant neoplasm of breast: Secondary | ICD-10-CM | POA: Diagnosis present

## 2021-12-25 ENCOUNTER — Other Ambulatory Visit: Payer: Self-pay | Admitting: Internal Medicine

## 2021-12-25 DIAGNOSIS — R928 Other abnormal and inconclusive findings on diagnostic imaging of breast: Secondary | ICD-10-CM

## 2021-12-25 DIAGNOSIS — N6489 Other specified disorders of breast: Secondary | ICD-10-CM

## 2022-01-12 ENCOUNTER — Ambulatory Visit
Admission: RE | Admit: 2022-01-12 | Discharge: 2022-01-12 | Disposition: A | Discharge status: Home / Self Care | Payer: Medicare Other | Source: Ambulatory Visit | Attending: Internal Medicine | Admitting: Internal Medicine

## 2022-01-12 DIAGNOSIS — N6489 Other specified disorders of breast: Secondary | ICD-10-CM

## 2022-01-12 DIAGNOSIS — R928 Other abnormal and inconclusive findings on diagnostic imaging of breast: Secondary | ICD-10-CM

## 2022-01-23 ENCOUNTER — Ambulatory Visit (INDEPENDENT_AMBULATORY_CARE_PROVIDER_SITE_OTHER): Payer: Medicare Other | Admitting: Internal Medicine

## 2022-01-23 ENCOUNTER — Encounter: Payer: Self-pay | Admitting: Internal Medicine

## 2022-01-23 VITALS — BP 128/80 | HR 79 | Ht 68.0 in | Wt 161.0 lb

## 2022-01-23 DIAGNOSIS — I10 Essential (primary) hypertension: Secondary | ICD-10-CM | POA: Diagnosis not present

## 2022-01-23 DIAGNOSIS — Z1211 Encounter for screening for malignant neoplasm of colon: Secondary | ICD-10-CM | POA: Diagnosis not present

## 2022-01-23 MED ORDER — LOSARTAN POTASSIUM 50 MG PO TABS: 50.0000 mg | ORAL_TABLET | Freq: Every day | ORAL | 1 refills | Status: DC

## 2022-01-23 NOTE — Progress Notes (Signed)
Date:  01/23/2022   Name:  Tonya Murray   DOB:  17-Jun-1955   MRN:  [id number]   Chief Complaint: Hypertension   Hypertension This is a chronic problem. The problem is unchanged. Pertinent negatives include no chest pain, headaches or shortness of breath. There are no associated agents to hypertension. Past treatments include angiotensin blockers (losartan started last visit). The current treatment provides significant improvement. There are no compliance problems.  Hypertensive end-organ damage includes CVA. There is no history of kidney disease or CAD/MI.    Lab Results  Component Value Date   NA 139 11/25/2021   K 4.8 11/25/2021   CO2 22 11/25/2021   GLUCOSE 105 (H) 11/25/2021   BUN 18 11/25/2021   CREATININE 0.96 11/25/2021   CALCIUM 10.0 11/25/2021   EGFR 66 11/25/2021   GFRNONAA >60 05/13/2018   Lab Results  Component Value Date   CHOL 258 (H) 05/11/2018   HDL 57 05/11/2018   LDLCALC 168 (H) 05/11/2018   TRIG 179 (H) 05/11/2018   CHOLHDL 4.5 05/11/2018   Lab Results  Component Value Date   TSH 2.600 11/25/2021   No results found for: "HGBA1C" Lab Results  Component Value Date   WBC 7.3 11/25/2021   HGB 13.6 11/25/2021   HCT 39.3 11/25/2021   MCV 86 11/25/2021   PLT 320 11/25/2021   Lab Results  Component Value Date   ALT 15 11/25/2021   AST 20 11/25/2021   ALKPHOS 107 11/25/2021   BILITOT 0.2 11/25/2021   No results found for: "25OHVITD2", "25OHVITD3", "VD25OH"   Review of Systems  Constitutional:  Negative for chills, fatigue and fever.  Respiratory:  Negative for chest tightness and shortness of breath.   Cardiovascular:  Negative for chest pain and leg swelling.  Gastrointestinal:  Negative for anal bleeding, constipation and diarrhea.  Musculoskeletal:  Negative for arthralgias and gait problem.  Neurological:  Negative for dizziness and headaches.  Psychiatric/Behavioral:  Positive for sleep disturbance. Negative for dysphoric mood.  The patient is not nervous/anxious.     Patient Active Problem List   Diagnosis Date Noted   Pharyngeal dysphagia 11/25/2021   GERD without esophagitis 11/25/2021   Status post hysterectomy 07/04/2018   History of closed head injury 06/21/2018   Hyperlipidemia LDL goal <70 06/10/2018   Cystocele, midline 05/24/2018   Cerebellar atrophy ([place]) 05/20/2018   Carotid atherosclerosis, bilateral 05/20/2018   Hx of completed stroke 05/20/2018   Family history of TIAs 05/11/2018   Essential hypertension 08/27/2009    Allergies  Allergen Reactions   Dilantin [Phenytoin Sodium Extended] Rash    Past Surgical History:  Procedure Laterality Date   ANTERIOR AND POSTERIOR REPAIR WITH SACROSPINOUS FIXATION N/A 05/26/2018   Procedure: ANTERIOR AND POSTERIOR REPAIR WITH SACROSPINOUS FIXATION;  Surgeon: Gae Dry, MD;  Location: [facility] ORS;  Service: Gynecology;  Laterality: N/A;   DILATION AND CURETTAGE OF UTERUS     after one of her deliveries    TONSILLECTOMY     TRACHEOSTOMY CLOSURE  1980   VAGINAL HYSTERECTOMY Bilateral 05/26/2018   Procedure: HYSTERECTOMY VAGINAL;  Surgeon: Gae Dry, MD;  Location: [facility] ORS;  Service: Gynecology;  Laterality: Bilateral;    Social History   Tobacco Use   Smoking status: Former    Years: 4.00    Types: Cigarettes    Quit date: 05/13/1976    Years since quitting: 45.7   Smokeless tobacco: Never  Vaping Use   Vaping Use: Never used  Substance Use Topics   Alcohol use: No   Drug use: Not Currently    Comment: prescribed     Medication list has been reviewed and updated.  Current Meds  Medication Sig   Fluocinolone Acetonide 0.01 % OIL SMARTSIG:2-4 Drop(s) In Ear(s) Twice a Week PRN   [DISCONTINUED] losartan (COZAAR) 50 MG tablet Take 1 tablet (50 mg total) by mouth daily.       01/23/2022    2:48 PM 11/25/2021    3:14 PM  GAD 7 : Generalized Anxiety Score  Nervous, Anxious, on Edge 0 0  Control/stop worrying 0 0  Worry too  much - different things 0 0  Trouble relaxing 0 1  Restless 0 0  Easily annoyed or irritable 1 1  Afraid - awful might happen 0 0  Total GAD 7 Score 1 2  Anxiety Difficulty Not difficult at all Not difficult at all       01/23/2022    2:47 PM 11/25/2021    3:14 PM 06/10/2018    9:11 AM  Depression screen PHQ 2/9  Decreased Interest 1 1 0  Down, Depressed, Hopeless 0 0 0  PHQ - 2 Score 1 1 0  Altered sleeping 1 1 0  Tired, decreased energy 1 1 0  Change in appetite 0 0 0  Feeling bad or failure about yourself  0 0 0  Trouble concentrating 1 1 0  Moving slowly or fidgety/restless 0 0 0  Suicidal thoughts 0 0 0  PHQ-9 Score 4 4 0  Difficult doing work/chores Not difficult at all Not difficult at all Not difficult at all    BP Readings from Last 3 Encounters:  01/23/22 128/80  11/25/21 (!) 154/102  07/01/20 (!) 168/94    Physical Exam Vitals and nursing note reviewed.  Constitutional:      General: She is not in acute distress.    Appearance: Normal appearance. She is well-developed.  HENT:     Head: Normocephalic and atraumatic.  Cardiovascular:     Rate and Rhythm: Normal rate and regular rhythm.     Pulses: Normal pulses.     Heart sounds: No murmur heard. Pulmonary:     Effort: Pulmonary effort is normal. No respiratory distress.     Breath sounds: No wheezing or rhonchi.  Musculoskeletal:     Cervical back: Normal range of motion.     Right lower leg: No edema.     Left lower leg: No edema.  Skin:    General: Skin is warm and dry.     Findings: No rash.  Neurological:     Mental Status: She is alert and oriented to person, place, and time.  Psychiatric:        Mood and Affect: Mood normal.        Behavior: Behavior normal.     Wt Readings from Last 3 Encounters:  01/23/22 161 lb (73 kg)  11/25/21 160 lb (72.6 kg)  07/01/20 162 lb (73.5 kg)    BP 128/80 (BP Location: Right Arm, Cuff Size: Normal)   Pulse 79   Ht _0  (1.727 m)   Wt 161 lb (73 kg)    SpO2 96%   BMI 24.48 kg/m   Assessment and Plan: 1. Essential hypertension BP is much improved with losartan.  No medication side effects noted. Continue exercise and low sodium diet - losartan (COZAAR) 50 MG tablet; Take 1 tablet (50 mg total) by mouth daily.  Dispense: 90 tablet; Refill:  1  2. Colon cancer screening Due for colonoscopy - Ambulatory referral to Gastroenterology   Partially dictated using Dragon software. Any errors are unintentional.  Halina Maidens, MD [facility]  01/23/2022

## 2022-01-27 ENCOUNTER — Other Ambulatory Visit: Payer: Self-pay

## 2022-01-27 ENCOUNTER — Telehealth: Payer: Self-pay

## 2022-01-27 DIAGNOSIS — Z1211 Encounter for screening for malignant neoplasm of colon: Secondary | ICD-10-CM

## 2022-01-27 MED ORDER — NA SULFATE-K SULFATE-MG SULF 17.5-3.13-1.6 GM/177ML PO SOLN: 1.0000 | Freq: Once | ORAL | 0 refills | Status: AC

## 2022-01-27 NOTE — Telephone Encounter (Signed)
Gastroenterology Pre-Procedure Review  Request Date: 02/24/22 Requesting Physician: Dr. Allegra Lai  PATIENT REVIEW QUESTIONS: The patient responded to the following health history questions as indicated:    1. Are you having any GI issues? no 2. Do you have a personal history of Polyps? no 3. Do you have a family history of Colon Cancer or Polyps? no 4. Diabetes Mellitus? no 5. Joint replacements in the past 12 months?no 6. Major health problems in the past 3 months?no 7. Any artificial heart valves, MVP, or defibrillator?no    MEDICATIONS & ALLERGIES:    Patient reports the following regarding taking any anticoagulation/antiplatelet therapy:   Plavix, Coumadin, Eliquis, Xarelto, Lovenox, Pradaxa, Brilinta, or Effient? no Aspirin? no  Patient confirms/reports the following medications:  Current Outpatient Medications  Medication Sig Dispense Refill   Fluocinolone Acetonide 0.01 % OIL SMARTSIG:2-4 Drop(s) In Ear(s) Twice a Week PRN     losartan (COZAAR) 50 MG tablet Take 1 tablet (50 mg total) by mouth daily. 90 tablet 1   No current facility-administered medications for this visit.    Patient confirms/reports the following allergies:  Allergies  Allergen Reactions   Dilantin [Phenytoin Sodium Extended] Rash    No orders of the defined types were placed in this encounter.   AUTHORIZATION INFORMATION Primary Insurance: 1D#: Group #:  Secondary Insurance: 1D#: Group #:  SCHEDULE INFORMATION: Date: 02/24/22 Time: Location: [facility]

## 2022-02-23 ENCOUNTER — Telehealth: Payer: Self-pay | Admitting: Gastroenterology

## 2022-02-23 NOTE — Telephone Encounter (Signed)
Patient called and said to disregard her last voicemail. She wants to keep her colonoscopy for tomorrow.

## 2022-02-24 ENCOUNTER — Ambulatory Visit: Payer: Medicare Other | Admitting: Certified Registered Nurse Anesthetist

## 2022-02-24 ENCOUNTER — Ambulatory Visit
Admission: RE | Admit: 2022-02-24 | Discharge: 2022-02-24 | Disposition: A | Discharge status: Home / Self Care | Payer: Medicare Other | Attending: Gastroenterology | Admitting: Gastroenterology

## 2022-02-24 ENCOUNTER — Encounter
Admission: RE | Disposition: A | Discharge status: Home / Self Care | Payer: Self-pay | Source: Home / Self Care | Attending: Gastroenterology

## 2022-02-24 ENCOUNTER — Other Ambulatory Visit: Payer: Self-pay

## 2022-02-24 ENCOUNTER — Encounter: Payer: Self-pay | Admitting: Gastroenterology

## 2022-02-24 DIAGNOSIS — Z87891 Personal history of nicotine dependence: Secondary | ICD-10-CM | POA: Diagnosis not present

## 2022-02-24 DIAGNOSIS — Z1211 Encounter for screening for malignant neoplasm of colon: Secondary | ICD-10-CM | POA: Insufficient documentation

## 2022-02-24 DIAGNOSIS — Z79899 Other long term (current) drug therapy: Secondary | ICD-10-CM | POA: Insufficient documentation

## 2022-02-24 DIAGNOSIS — I10 Essential (primary) hypertension: Secondary | ICD-10-CM | POA: Insufficient documentation

## 2022-02-24 DIAGNOSIS — T7840XA Allergy, unspecified, initial encounter: Secondary | ICD-10-CM | POA: Diagnosis not present

## 2022-02-24 DIAGNOSIS — K219 Gastro-esophageal reflux disease without esophagitis: Secondary | ICD-10-CM | POA: Diagnosis not present

## 2022-02-24 HISTORY — PX: COLONOSCOPY WITH PROPOFOL: SHX5780

## 2022-02-24 SURGERY — COLONOSCOPY WITH PROPOFOL
Anesthesia: General

## 2022-02-24 MED ORDER — PROPOFOL 500 MG/50ML IV EMUL
INTRAVENOUS | Status: DC | PRN
  Administered 2022-02-24: 140 ug/kg/min via INTRAVENOUS

## 2022-02-24 MED ORDER — PROPOFOL 10 MG/ML IV BOLUS
INTRAVENOUS | Status: DC | PRN
  Administered 2022-02-24: 30 mg via INTRAVENOUS
  Administered 2022-02-24: 50 mg via INTRAVENOUS

## 2022-02-24 MED ORDER — SODIUM CHLORIDE 0.9 % IV SOLN: INTRAVENOUS | Status: DC

## 2022-02-24 MED ORDER — LIDOCAINE HCL (CARDIAC) PF 100 MG/5ML IV SOSY
PREFILLED_SYRINGE | INTRAVENOUS | Status: DC | PRN
  Administered 2022-02-24: 50 mg via INTRAVENOUS

## 2022-02-24 NOTE — Transfer of Care (Signed)
Immediate Anesthesia Transfer of Care Note  Patient: Tonya Murray  Procedure(s) Performed: COLONOSCOPY WITH PROPOFOL  Patient Location: PACU  Anesthesia Type:General  Level of Consciousness: drowsy  Airway & Oxygen Therapy: Patient Spontanous Breathing  Post-op Assessment: Report given to RN and Post -op Vital signs reviewed and stable  Post vital signs: Reviewed and stable  Last Vitals:  Vitals Value Taken Time  BP 105/69 02/24/22 0950  Temp    Pulse 62 02/24/22 0950  Resp 16   SpO2 100 % 02/24/22 0950  Vitals shown include unvalidated device data.  Last Pain:  Vitals:   02/24/22 0906  TempSrc: Temporal  PainSc: 0-No pain         Complications: No notable events documented.

## 2022-02-24 NOTE — Anesthesia Procedure Notes (Signed)
Date/Time: 02/24/2022 9:29 AM  Performed by: Lily Peer, Jacquez Sheetz, CRNAPre-anesthesia Checklist: Patient identified, Emergency Drugs available, Suction available, Patient being monitored and Timeout performed Patient Re-evaluated:Patient Re-evaluated prior to induction Oxygen Delivery Method: Nasal cannula Induction Type: IV induction

## 2022-02-24 NOTE — Anesthesia Preprocedure Evaluation (Addendum)
Anesthesia Evaluation  Patient identified by MRN, date of birth, ID band Patient awake    Reviewed: Allergy & Precautions, NPO status , Patient's Chart, lab work & pertinent test results  History of Anesthesia Complications Negative for: history of anesthetic complications  Airway Mallampati: III       Dental  (+) Poor Dentition, Partial Upper, Missing   Pulmonary neg pulmonary ROS, former smoker,    Pulmonary exam normal        Cardiovascular Exercise Tolerance: Good hypertension, Pt. on medications (-) angina(-) DOE negative cardio ROS Normal cardiovascular exam     Neuro/Psych negative neurological ROS  negative psych ROS   GI/Hepatic Neg liver ROS, GERD  ,  Endo/Other  negative endocrine ROS  Renal/GU negative Renal ROS  Female GU complaint  negative genitourinary   Musculoskeletal negative musculoskeletal ROS (+)   Abdominal Normal abdominal exam  (+)   Peds negative pediatric ROS (+)  Hematology negative hematology ROS (+)   Anesthesia Other Findings Past Medical History: No date: Bladder prolapse, female, acquired No date: Chest pain No date: HTN (hypertension) 2000s: Normal cardiac stress test     Comment:  normal with palps No date: Postmenopausal  Reproductive/Obstetrics negative OB ROS                            Anesthesia Physical  Anesthesia Plan  ASA: 3  Anesthesia Plan: General   Post-op Pain Management: Minimal or no pain anticipated   Induction: Intravenous  PONV Risk Score and Plan: 2 and Propofol infusion and TIVA  Airway Management Planned: Oral ETT  Additional Equipment:   Intra-op Plan:   Post-operative Plan: Extubation in OR  Informed Consent: I have reviewed the patients History and Physical, chart, labs and discussed the procedure including the risks, benefits and alternatives for the proposed anesthesia with the patient or authorized  representative who has indicated his/her understanding and acceptance.     Dental advisory given  Plan Discussed with: CRNA and Surgeon  Anesthesia Plan Comments: (Patient consented for risks of anesthesia including but not limited to:  - adverse reactions to medications - risk of airway placement if required - damage to eyes, teeth, lips or other oral mucosa - nerve damage due to positioning  - sore throat or hoarseness - Damage to heart, brain, nerves, lungs, other parts of body or loss of life  Patient voiced understanding.)       Anesthesia Quick Evaluation

## 2022-02-24 NOTE — Anesthesia Postprocedure Evaluation (Signed)
Anesthesia Post Note  Patient: Tonya Murray [facility]  Procedure(s) Performed: COLONOSCOPY WITH PROPOFOL  Patient location during evaluation: Endoscopy Anesthesia Type: General Level of consciousness: awake and alert Pain management: pain level controlled Vital Signs Assessment: post-procedure vital signs reviewed and stable Respiratory status: spontaneous breathing, nonlabored ventilation, respiratory function stable and patient connected to nasal cannula oxygen Cardiovascular status: blood pressure returned to baseline and stable Postop Assessment: no apparent nausea or vomiting Anesthetic complications: no   No notable events documented.   Last Vitals:  Vitals:   02/24/22 1010 02/24/22 1020  BP: 127/78 (!) 152/85  Pulse: (!) 57 (!) 52  Resp: 13 13  Temp:    SpO2: 100% 100%    Last Pain:  Vitals:   02/24/22 1020  TempSrc:   PainSc: 0-No pain                 Ilene Qua

## 2022-02-24 NOTE — H&P (Signed)
Tonya Repress, MD [address]  [address]  [place], [place] [address]  Main: [phone]  Fax: [phone] Pager: [phone]  Primary Care Physician:  Tonya Milan, MD Primary Gastroenterologist:  Dr. Arlyss Murray  Pre-Procedure History & Physical: HPI:  Tonya Murray is a 66 y.o. female is here for an colonoscopy.   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   HTN (hypertension)    Hyperlipidemia    Normal cardiac stress test 2000s   normal with palps   Postmenopausal     Past Surgical History:  Procedure Laterality Date   ANTERIOR AND POSTERIOR REPAIR WITH SACROSPINOUS FIXATION N/A 05/26/2018   Procedure: ANTERIOR AND POSTERIOR REPAIR WITH SACROSPINOUS FIXATION;  Surgeon: Nadara Mustard, MD;  Location: [facility] ORS;  Service: Gynecology;  Laterality: N/A;   DILATION AND CURETTAGE OF UTERUS     after one of her deliveries    TONSILLECTOMY     TRACHEOSTOMY CLOSURE  1980   VAGINAL HYSTERECTOMY Bilateral 05/26/2018   Procedure: HYSTERECTOMY VAGINAL;  Surgeon: Nadara Mustard, MD;  Location: [facility] ORS;  Service: Gynecology;  Laterality: Bilateral;    Prior to Admission medications   Medication Sig Start Date End Date Taking? Authorizing Provider  Fluocinolone Acetonide 0.01 % OIL SMARTSIG:2-4 Drop(s) In Ear(s) Twice a Week PRN 09/19/21   [provider]  losartan (COZAAR) 50 MG tablet Take 1 tablet (50 mg total) by mouth daily. 01/23/22   Tonya Milan, MD    Allergies as of 01/28/2022 - Review Complete 01/23/2022  Allergen Reaction Noted   Dilantin [phenytoin sodium extended] Rash 11/08/2017    Family History  Problem Relation Age of Onset   Hypertension Mother    Cancer Mother    Arrhythmia Mother    Diabetes Father    Cholelithiasis Father    Diabetes Sister    Cervical cancer Maternal Grandmother    Bladder Cancer Neg Hx    Kidney  cancer Neg Hx    Breast cancer Neg Hx     Social History   Socioeconomic History   Marital status: Married    Spouse name: Fayrene Fearing   Number of children: 3   Years of education: 12   Highest education level: Bachelor's degree (e.g., BA, AB, BS)  Occupational History   Not on file  Tobacco Use   Smoking status: Former    Years: 4.00    Types: Cigarettes    Quit date: 05/13/1976    Years since quitting: 45.8   Smokeless tobacco: Never  Vaping Use   Vaping Use: Never used  Substance and Sexual Activity   Alcohol use: No   Drug use: Not Currently    Comment: prescribed   Sexual activity: Not Currently  Other Topics Concern   Not on file  Social History Narrative   Full time. Regularly exercises.    Social Determinants of Health   Financial Resource Strain: Low Risk  (01/23/2022)   Overall Financial Resource Strain (CARDIA)    Difficulty of Paying Living Expenses: Not hard at all  Food Insecurity: No Food Insecurity (01/23/2022)   Hunger Vital Sign    Worried About Running Out of Food in the Last Year: Never true    Ran Out of Food in the Last Year: Never true  Transportation Needs: No Transportation Needs (01/23/2022)   PRAPARE - Transportation  Lack of Transportation (Medical): No    Lack of Transportation (Non-Medical): No  Physical Activity: Insufficiently Active (05/11/2018)   Exercise Vital Sign    Days of Exercise per Week: 5 days    Minutes of Exercise per Session: 10 min  Stress: No Stress Concern Present (05/11/2018)   [place]    Feeling of Stress : Not at all  Social Connections: Somewhat Isolated (05/11/2018)   Social Connection and Isolation Panel [NHANES]    Frequency of Communication with Friends and Family: Once a week    Frequency of Social Gatherings with Friends and Family: Once a week    Attends Religious Services: More than 4 times per year    Active Member of [employer] or Organizations:  No    Attends Archivist Meetings: Never    Marital Status: Married  Human resources officer Violence: Not At Risk (01/23/2022)   Humiliation, Afraid, Rape, and Kick questionnaire    Fear of Current or Ex-Partner: No    Emotionally Abused: No    Physically Abused: No    Sexually Abused: No    Review of Systems: See HPI, otherwise negative ROS  Physical Exam: BP (!) 168/75   Pulse 66   Temp (!) 97.1 F (36.2 C) (Temporal)   Resp 20   Ht 5\' 8"  (1.727 m)   Wt 72.6 kg   SpO2 100%   BMI 24.33 kg/m  General:   Alert,  pleasant and cooperative in NAD Head:  Normocephalic and atraumatic. Neck:  Supple; no masses or thyromegaly. Lungs:  Clear throughout to auscultation.    Heart:  Regular rate and rhythm. Abdomen:  Soft, nontender and nondistended. Normal bowel sounds, without guarding, and without rebound.   Neurologic:  Alert and  oriented x4;  grossly normal neurologically.  Impression/Plan: Tonya Murray is here for an colonoscopy to be performed for colon cancer screening  Risks, benefits, limitations, and alternatives regarding  endoscopy have been reviewed with the patient.  Questions have been answered.  All parties agreeable.   Sherri Sear, MD  02/24/2022, 9:28 AM

## 2022-02-24 NOTE — Op Note (Signed)
[facility] Gastroenterology Patient Name: Tonya Murray Procedure Date: 02/24/2022 9:16 AM MRN: [id number] Account #: [id number] Date of Birth: 1956/05/09 Admit Type: Outpatient Age: 66 Room: [facility] ENDO ROOM 3 Gender: Female Note Status: Finalized Instrument Name: Nelda Marseille [id number] Procedure:             Colonoscopy Indications:           Screening for colorectal malignant neoplasm, This is                         the patient's first colonoscopy Providers:             Toney Reil MD, MD Referring MD:          Toney Reil MD, MD (Referring MD), Bari Edward, MD (Referring MD) Medicines:             General Anesthesia Complications:         No immediate complications. Estimated blood loss: None. Procedure:             Pre-Anesthesia Assessment:                        - Prior to the procedure, a History and Physical was                         performed, and patient medications and allergies were                         reviewed. The patient is competent. The risks and                         benefits of the procedure and the sedation options and                         risks were discussed with the patient. All questions                         were answered and informed consent was obtained.                         Patient identification and proposed procedure were                         verified by the physician, the nurse, the                         anesthesiologist, the anesthetist and the technician                         in the pre-procedure area in the procedure room in the                         endoscopy suite. Mental Status Examination: alert and                         oriented. Airway Examination: normal oropharyngeal  airway and neck mobility. Respiratory Examination:                         clear to auscultation. CV Examination: normal.                         Prophylactic  Antibiotics: The patient does not require                         prophylactic antibiotics. Prior Anticoagulants: The                         patient has taken no previous anticoagulant or                         antiplatelet agents. ASA Grade Assessment: III - A                         patient with severe systemic disease. After reviewing                         the risks and benefits, the patient was deemed in                         satisfactory condition to undergo the procedure. The                         anesthesia plan was to use general anesthesia.                         Immediately prior to administration of medications,                         the patient was re-assessed for adequacy to receive                         sedatives. The heart rate, respiratory rate, oxygen                         saturations, blood pressure, adequacy of pulmonary                         ventilation, and response to care were monitored                         throughout the procedure. The physical status of the                         patient was re-assessed after the procedure.                        After obtaining informed consent, the colonoscope was                         passed under direct vision. Throughout the procedure,                         the patient's blood pressure, pulse, and oxygen  saturations were monitored continuously. The                         Colonoscope was introduced through the anus and                         advanced to the the cecum, identified by appendiceal                         orifice and ileocecal valve. The colonoscopy was                         performed without difficulty. The patient tolerated                         the procedure well. The quality of the bowel                         preparation was evaluated using the BBPS [facility] Bowel                         Preparation Scale) with scores of: Right Colon = 3,                          Transverse Colon = 3 and Left Colon = 3 (entire mucosa                         seen well with no residual staining, small fragments                         of stool or opaque liquid). The total BBPS score                         equals 9. Findings:      The perianal and digital rectal examinations were normal. Pertinent       negatives include normal sphincter tone and no palpable rectal lesions.      The entire examined colon appeared normal.      The retroflexed view of the distal rectum and anal verge was normal and       showed no anal or rectal abnormalities. Impression:            - The entire examined colon is normal.                        - The distal rectum and anal verge are normal on                         retroflexion view.                        - No specimens collected. Recommendation:        - Discharge patient to home (with escort).                        - Resume previous diet today.                        - Continue present medications.                        -  Repeat colonoscopy in 10 years for screening                         purposes. Procedure Code(s):     --- Professional ---                        Z6109, Colorectal cancer screening; colonoscopy on                         individual not meeting criteria for high risk Diagnosis Code(s):     --- Professional ---                        Z12.11, Encounter for screening for malignant neoplasm                         of colon CPT copyright 2019 American Medical Association. All rights reserved. The codes documented in this report are preliminary and upon coder review may  be revised to meet current compliance requirements. Dr. Ulyess Mort Lin Landsman MD, MD 02/24/2022 9:48:11 AM This report has been signed electronically. Number of Addenda: 0 Note Initiated On: 02/24/2022 9:16 AM Scope Withdrawal Time: 0 hours 6 minutes 58 seconds  Total Procedure Duration: 0 hours 13 minutes 14 seconds  Estimated  Blood Loss:  Estimated blood loss: none.      [facility]

## 2022-02-25 ENCOUNTER — Encounter: Payer: Self-pay | Admitting: Gastroenterology

## 2022-06-19 ENCOUNTER — Ambulatory Visit: Payer: Medicare Other

## 2022-06-29 ENCOUNTER — Ambulatory Visit (INDEPENDENT_AMBULATORY_CARE_PROVIDER_SITE_OTHER): Payer: Medicare Other

## 2022-06-29 NOTE — Patient Instructions (Signed)

## 2022-06-29 NOTE — Progress Notes (Signed)
I attempted to contact the patient several times concerning her medicare wellness visit. No answer. I left a detailed message on her voicemail to return my call.

## 2022-07-22 ENCOUNTER — Other Ambulatory Visit: Payer: Self-pay | Admitting: Internal Medicine

## 2022-07-22 DIAGNOSIS — I10 Essential (primary) hypertension: Secondary | ICD-10-CM

## 2022-07-23 NOTE — Telephone Encounter (Signed)
Spoke to patient, she will call back with her work schedule

## 2022-07-23 NOTE — Telephone Encounter (Signed)
Please schedule both appts for pt.  KP

## 2022-08-05 ENCOUNTER — Ambulatory Visit (INDEPENDENT_AMBULATORY_CARE_PROVIDER_SITE_OTHER): Payer: Medicare Other

## 2022-08-05 VITALS — BP 138/78 | Ht 68.0 in | Wt 160.2 lb

## 2022-08-05 DIAGNOSIS — Z0000 Encounter for general adult medical examination without abnormal findings: Secondary | ICD-10-CM | POA: Diagnosis not present

## 2022-08-05 DIAGNOSIS — Z780 Asymptomatic menopausal state: Secondary | ICD-10-CM

## 2022-08-05 NOTE — Progress Notes (Signed)
Subjective:   Tonya Murray is a 67 y.o. female who presents for Medicare Annual (Subsequent) preventive examination.  Review of Systems     Cardiac Risk Factors include: advanced age (>41mn, >>14women);hypertension     Objective:    Today's Vitals   08/05/22 1029  BP: (!) 160/78  Weight: 160 lb 3.2 oz (72.7 kg)  Height: '5\' 8"'$  (1.727 m)   Body mass index is 24.36 kg/m.     08/05/2022   10:39 AM 02/24/2022    9:10 AM 05/13/2018   11:02 AM  Advanced Directives  Does Patient Have a Medical Advance Directive? No No No  Would patient like information on creating a medical advance directive? No - Patient declined No - Patient declined     Current Medications (verified) Outpatient Encounter Medications as of 08/05/2022  Medication Sig   Fluocinolone Acetonide 0.01 % OIL SMARTSIG:2-4 Drop(s) In Ear(s) Twice a Week PRN   losartan (COZAAR) 50 MG tablet Take 1 tablet by mouth once daily   No facility-administered encounter medications on file as of 08/05/2022.    Allergies (verified) Dilantin [phenytoin sodium extended]   History: 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   HTN (hypertension)    Hyperlipidemia    Normal cardiac stress test 2000s   normal with palps   Postmenopausal    Past Surgical History:  Procedure Laterality Date   ANTERIOR AND POSTERIOR REPAIR WITH SACROSPINOUS FIXATION N/A 05/26/2018   Procedure: ANTERIOR AND POSTERIOR REPAIR WITH SACROSPINOUS FIXATION;  Surgeon: HGae Dry MD;  Location: [facility] ORS;  Service: Gynecology;  Laterality: N/A;   COLONOSCOPY WITH PROPOFOL N/A 02/24/2022   Procedure: COLONOSCOPY WITH PROPOFOL;  Surgeon: VLin Landsman MD;  [facility];  Service: Gastroenterology;  Laterality: N/A;   DILATION AND CURETTAGE OF UTERUS     after one of her deliveries    TONSILLECTOMY     TRACHEOSTOMY CLOSURE  1980    VAGINAL HYSTERECTOMY Bilateral 05/26/2018   Procedure: HYSTERECTOMY VAGINAL;  Surgeon: HGae Dry MD;  Location: [facility] ORS;  Service: Gynecology;  Laterality: Bilateral;   Family History  Problem Relation Age of Onset   Hypertension Mother    Cancer Mother    Arrhythmia Mother    Diabetes Father    Cholelithiasis Father    Diabetes Sister    Cervical cancer Maternal Grandmother    Bladder Cancer Neg Hx    Kidney cancer Neg Hx    Breast cancer Neg Hx    Social History   Socioeconomic History   Marital status: Married    Spouse name: JJeneen Rinks  Number of children: 3   Years of education: 12   Highest education level: Bachelor's degree (e.g., BA, AB, BS)  Occupational History   Not on file  Tobacco Use   Smoking status: Former    Years: 4.00    Types: Cigarettes    Quit date: 05/13/1976    Years since quitting: 46.2   Smokeless tobacco: Never  Vaping Use   Vaping Use: Never used  Substance and Sexual Activity   Alcohol use: No   Drug use: Not Currently    Comment: prescribed   Sexual activity: Not Currently  Other Topics Concern   Not on file  Social History Narrative   Full time. Regularly exercises.    Social Determinants of Health   Financial  Resource Strain: Low Risk  (08/05/2022)   Overall Financial Resource Strain (CARDIA)    Difficulty of Paying Living Expenses: Not very hard  Food Insecurity: No Food Insecurity (08/05/2022)   Hunger Vital Sign    Worried About Running Out of Food in the Last Year: Never true    Ran Out of Food in the Last Year: Never true  Transportation Needs: No Transportation Needs (08/05/2022)   PRAPARE - Hydrologist (Medical): No    Lack of Transportation (Non-Medical): No  Physical Activity: Sufficiently Active (08/05/2022)   Exercise Vital Sign    Days of Exercise per Week: 4 days    Minutes of Exercise per Session: 60 min  Stress: No Stress Concern Present (08/05/2022)   [place]    Feeling of Stress : Not at all  Social Connections: Moderately Integrated (08/05/2022)   Social Connection and Isolation Panel [NHANES]    Frequency of Communication with Friends and Family: More than three times a week    Frequency of Social Gatherings with Friends and Family: More than three times a week    Attends Religious Services: More than 4 times per year    Active Member of [employer] or Organizations: No    Attends Music therapist: Never    Marital Status: Married    Tobacco Counseling Counseling given: Not Answered   Clinical Intake:  Pre-visit preparation completed: Yes  Pain : No/denies pain     Nutritional Risks: None Diabetes: No  How often do you need to have someone help you when you read instructions, pamphlets, or other written materials from your doctor or pharmacy?: 1 - Never  Diabetic?no  Interpreter Needed?: No  Information entered by :: Kirke Shaggy, LPN   Activities of Daily Living    08/05/2022   10:40 AM  In your present state of health, do you have any difficulty performing the following activities:  Hearing? 0  Vision? 0  Difficulty concentrating or making decisions? 0  Walking or climbing stairs? 0  Dressing or bathing? 0  Doing errands, shopping? 0  Preparing Food and eating ? N  Using the Toilet? N  In the past six months, have you accidently leaked urine? N  Do you have problems with loss of bowel control? N  Managing your Medications? N  Managing your Finances? N  Housekeeping or managing your Housekeeping? N    Patient Care Team: Glean Hess, MD as PCP - General (Internal Medicine) Gae Dry, MD as Referring Physician (Obstetrics and Gynecology) Delana Meyer, Dolores Lory, MD (Vascular Surgery) Lucilla Lame, MD as Consulting Physician (Gastroenterology)  Indicate any recent Medical Services you may have received from other than [facility] providers in the  past year (date may be approximate).     Assessment:   This is a routine wellness examination for [facility].  Hearing/Vision screen Hearing Screening - Comments:: No aids Vision Screening - Comments:: readers  Dietary issues and exercise activities discussed: Current Exercise Habits: Home exercise routine, Type of exercise: walking, Time (Minutes): 60, Frequency (Times/Week): 4, Weekly Exercise (Minutes/Week): 240, Intensity: Mild   Goals Addressed             This Visit's Progress    DIET - EAT MORE FRUITS AND VEGETABLES         Depression Screen    08/05/2022   10:37 AM 01/23/2022    2:47 PM 11/25/2021    3:14  PM 06/10/2018    9:11 AM 05/11/2018   10:07 AM  PHQ 2/9 Scores  PHQ - 2 Score 0 1 1 0 0  PHQ- 9 Score 0 4 4 0 0    Fall Risk    08/05/2022   10:40 AM 01/23/2022    2:48 PM 11/25/2021    3:14 PM 06/10/2018    9:11 AM 05/11/2018   10:07 AM  [place] in the past year? 0 0 0 0 0  Number falls in past yr: 0 0 0 0   Injury with Fall? 0 0 0 0   Risk for fall due to : No Fall Risks No Fall Risks No Fall Risks    Follow up Falls prevention discussed;Falls evaluation completed Falls evaluation completed Falls evaluation completed      FALL RISK PREVENTION PERTAINING TO THE HOME:  Any stairs in or around the home? Yes  If so, are there any without handrails? No  Home free of loose throw rugs in walkways, pet beds, electrical cords, etc? Yes  Adequate lighting in your home to reduce risk of falls? Yes   ASSISTIVE DEVICES UTILIZED TO PREVENT FALLS:  Life alert? No  Use of a cane, walker or w/c? No  Grab bars in the bathroom? Yes  Shower chair or bench in shower? Yes  Elevated toilet seat or a handicapped toilet? No   TIMED UP AND GO:  Was the test performed? Yes .  Length of time to ambulate 10 feet: 4 sec.   Gait steady and fast without use of assistive device  Cognitive Function:        08/05/2022   10:47 AM  6CIT Screen  What Year? 0 points   What month? 0 points  What time? 0 points  Count back from 20 0 points  Months in reverse 0 points  Repeat phrase 2 points  Total Score 2 points    Immunizations Immunization History  Administered Date(s) Administered   PNEUMOCOCCAL CONJUGATE-20 11/25/2021   Tdap 05/11/2018    TDAP status: Up to date  Flu Vaccine status: Declined, Education has been provided regarding the importance of this vaccine but patient still declined. Advised may receive this vaccine at local pharmacy or Health Dept. Aware to provide a copy of the vaccination record if obtained from local pharmacy or Health Dept. Verbalized acceptance and understanding.  Pneumococcal vaccine status: Up to date  Covid-19 vaccine status: Declined, Education has been provided regarding the importance of this vaccine but patient still declined. Advised may receive this vaccine at local pharmacy or Health Dept.or vaccine clinic. Aware to provide a copy of the vaccination record if obtained from local pharmacy or Health Dept. Verbalized acceptance and understanding.  Qualifies for Shingles Vaccine? Yes   Zostavax completed No   Shingrix Completed?: No.    Education has been provided regarding the importance of this vaccine. Patient has been advised to call insurance company to determine out of pocket expense if they have not yet received this vaccine. Advised may also receive vaccine at local pharmacy or Health Dept. Verbalized acceptance and understanding.  Screening Tests Health Maintenance  Topic Date Due   COVID-19 Vaccine (1) Never done   Zoster Vaccines- Shingrix (1 of 2) Never done   DEXA SCAN  Never done   INFLUENZA VACCINE  Never done   MAMMOGRAM  01/13/2023   Medicare Annual Wellness (AWV)  08/06/2023   DTaP/Tdap/Td (2 - Td or Tdap) 05/11/2028   COLONOSCOPY (  Pts 45-62yr Insurance coverage will need to be confirmed)  02/25/2032   Pneumonia Vaccine 67 Years old  Completed   Hepatitis C Screening  Completed   HPV  VACCINES  Aged Out    Health Maintenance  Health Maintenance Due  Topic Date Due   COVID-19 Vaccine (1) Never done   Zoster Vaccines- Shingrix (1 of 2) Never done   DEXA SCAN  Never done   INFLUENZA VACCINE  Never done    Colorectal cancer screening: Type of screening: Colonoscopy. Completed 02/24/22. Repeat every 10 years  Mammogram status: Completed 01/12/22. Repeat every year  Bone Density status: Ordered 08/05/22. Pt provided with contact info and advised to call to schedule appt.  Lung Cancer Screening: (Low Dose CT Chest recommended if Age 572-80years, 30 pack-year currently smoking OR have quit w/in 15years.) does not qualify.   Additional Screening:  Hepatitis C Screening: does qualify; Completed 11/25/21  Vision Screening: Recommended annual ophthalmology exams for early detection of glaucoma and other disorders of the eye. Is the patient up to date with their annual eye exam?  No  Who is the provider or what is the name of the office in which the patient attends annual eye exams? No one If pt is not established with a provider, would they like to be referred to a provider to establish care? No .   Dental Screening: Recommended annual dental exams for proper oral hygiene  Community Resource Referral / Chronic Care Management: CRR required this visit?  No   CCM required this visit?  No      Plan:     I have personally reviewed and noted the following in the patient's chart:   Medical and social history Use of alcohol, tobacco or illicit drugs  Current medications and supplements including opioid prescriptions. Patient is not currently taking opioid prescriptions. Functional ability and status Nutritional status Physical activity Advanced directives List of other physicians Hospitalizations, surgeries, and ER visits in previous 12 months Vitals Screenings to include cognitive, depression, and falls Referrals and appointments  In addition, I have reviewed and  discussed with patient certain preventive protocols, quality metrics, and best practice recommendations. A written personalized care plan for preventive services as well as general preventive health recommendations were provided to patient.     LDionisio David LPN   [id number]  Nurse Notes: none

## 2022-08-05 NOTE — Patient Instructions (Signed)
Tonya Murray , Thank you for taking time to come for your Medicare Wellness Visit. I appreciate your ongoing commitment to your health goals. Please review the following plan we discussed and let me know if I can assist you in the future.   These are the goals we discussed:  Goals      DIET - EAT MORE FRUITS AND VEGETABLES        This is a list of the screening recommended for you and due dates:  Health Maintenance  Topic Date Due   COVID-19 Vaccine (1) Never done   Zoster (Shingles) Vaccine (1 of 2) Never done   DEXA scan (bone density measurement)  Never done   Flu Shot  Never done   Mammogram  01/13/2023   Medicare Annual Wellness Visit  08/06/2023   DTaP/Tdap/Td vaccine (2 - Td or Tdap) 05/11/2028   Colon Cancer Screening  02/25/2032   Pneumonia Vaccine  Completed   Hepatitis C Screening: USPSTF Recommendation to screen - Ages 18-79 yo.  Completed   HPV Vaccine  Aged Out    Advanced directives: no  Conditions/risks identified: none  Next appointment: Follow up in one year for your annual wellness visit 08/11/23 @ 9:45 am in person   Preventive Care 65 Years and Older, Female Preventive care refers to lifestyle choices and visits with your health care provider that can promote health and wellness. What does preventive care include? A yearly physical exam. This is also called an annual well check. Dental exams once or twice a year. Routine eye exams. Ask your health care provider how often you should have your eyes checked. Personal lifestyle choices, including: Daily care of your teeth and gums. Regular physical activity. Eating a healthy diet. Avoiding tobacco and drug use. Limiting alcohol use. Practicing safe sex. Taking low-dose aspirin every day. Taking vitamin and mineral supplements as recommended by your health care provider. What happens during an annual well check? The services and screenings done by your health care provider during your annual well  check will depend on your age, overall health, lifestyle risk factors, and family history of disease. Counseling  Your health care provider may ask you questions about your: Alcohol use. Tobacco use. Drug use. Emotional well-being. Home and relationship well-being. Sexual activity. Eating habits. History of falls. Memory and ability to understand (cognition). Work and work Statistician. Reproductive health. Screening  You may have the following tests or measurements: Height, weight, and BMI. Blood pressure. Lipid and cholesterol levels. These may be checked every 5 years, or more frequently if you are over 74 years old. Skin check. Lung cancer screening. You may have this screening every year starting at age 38 if you have a 30-pack-year history of smoking and currently smoke or have quit within the past 15 years. Fecal occult blood test (FOBT) of the stool. You may have this test every year starting at age 37. Flexible sigmoidoscopy or colonoscopy. You may have a sigmoidoscopy every 5 years or a colonoscopy every 10 years starting at age 72. Hepatitis C blood test. Hepatitis B blood test. Sexually transmitted disease (STD) testing. Diabetes screening. This is done by checking your blood sugar (glucose) after you have not eaten for a while (fasting). You may have this done every 1-3 years. Bone density scan. This is done to screen for osteoporosis. You may have this done starting at age 42. Mammogram. This may be done every 1-2 years. Talk to your health care provider about how often you  should have regular mammograms. Talk with your health care provider about your test results, treatment options, and if necessary, the need for more tests. Vaccines  Your health care provider may recommend certain vaccines, such as: Influenza vaccine. This is recommended every year. Tetanus, diphtheria, and acellular pertussis (Tdap, Td) vaccine. You may need a Td booster every 10 years. Zoster  vaccine. You may need this after age 14. Pneumococcal 13-valent conjugate (PCV13) vaccine. One dose is recommended after age 75. Pneumococcal polysaccharide (PPSV23) vaccine. One dose is recommended after age 68. Talk to your health care provider about which screenings and vaccines you need and how often you need them. This information is not intended to replace advice given to you by your health care provider. Make sure you discuss any questions you have with your health care provider. Document Released: 06/21/2015 Document Revised: 02/12/2016 Document Reviewed: 03/26/2015 Elsevier Interactive Patient Education  2017 [place] Prevention in the Home Falls can cause injuries. They can happen to people of all ages. There are many things you can do to make your home safe and to help prevent falls. What can I do on the outside of my home? Regularly fix the edges of walkways and driveways and fix any cracks. Remove anything that might make you trip as you walk through a door, such as a raised step or threshold. Trim any bushes or trees on the path to your home. Use bright outdoor lighting. Clear any walking paths of anything that might make someone trip, such as rocks or tools. Regularly check to see if handrails are loose or broken. Make sure that both sides of any steps have handrails. Any raised decks and porches should have guardrails on the edges. Have any leaves, snow, or ice cleared regularly. Use sand or salt on walking paths during winter. Clean up any spills in your garage right away. This includes oil or grease spills. What can I do in the bathroom? Use night lights. Install grab bars by the toilet and in the tub and shower. Do not use towel bars as grab bars. Use non-skid mats or decals in the tub or shower. If you need to sit down in the shower, use a plastic, non-slip stool. Keep the floor dry. Clean up any water that spills on the floor as soon as it happens. Remove  soap buildup in the tub or shower regularly. Attach bath mats securely with double-sided non-slip rug tape. Do not have throw rugs and other things on the floor that can make you trip. What can I do in the bedroom? Use night lights. Make sure that you have a light by your bed that is easy to reach. Do not use any sheets or blankets that are too big for your bed. They should not hang down onto the floor. Have a firm chair that has side arms. You can use this for support while you get dressed. Do not have throw rugs and other things on the floor that can make you trip. What can I do in the kitchen? Clean up any spills right away. Avoid walking on wet floors. Keep items that you use a lot in easy-to-reach places. If you need to reach something above you, use a strong step stool that has a grab bar. Keep electrical cords out of the way. Do not use floor polish or wax that makes floors slippery. If you must use wax, use non-skid floor wax. Do not have throw rugs and other things on the  floor that can make you trip. What can I do with my stairs? Do not leave any items on the stairs. Make sure that there are handrails on both sides of the stairs and use them. Fix handrails that are broken or loose. Make sure that handrails are as long as the stairways. Check any carpeting to make sure that it is firmly attached to the stairs. Fix any carpet that is loose or worn. Avoid having throw rugs at the top or bottom of the stairs. If you do have throw rugs, attach them to the floor with carpet tape. Make sure that you have a light switch at the top of the stairs and the bottom of the stairs. If you do not have them, ask someone to add them for you. What else can I do to help prevent falls? Wear shoes that: Do not have high heels. Have rubber bottoms. Are comfortable and fit you well. Are closed at the toe. Do not wear sandals. If you use a stepladder: Make sure that it is fully opened. Do not climb a  closed stepladder. Make sure that both sides of the stepladder are locked into place. Ask someone to hold it for you, if possible. Clearly mark and make sure that you can see: Any grab bars or handrails. First and last steps. Where the edge of each step is. Use tools that help you move around (mobility aids) if they are needed. These include: Canes. Walkers. Scooters. Crutches. Turn on the lights when you go into a dark area. Replace any light bulbs as soon as they burn out. Set up your furniture so you have a clear path. Avoid moving your furniture around. If any of your floors are uneven, fix them. If there are any pets around you, be aware of where they are. Review your medicines with your doctor. Some medicines can make you feel dizzy. This can increase your chance of falling. Ask your doctor what other things that you can do to help prevent falls. This information is not intended to replace advice given to you by your health care provider. Make sure you discuss any questions you have with your health care provider. Document Released: 03/21/2009 Document Revised: 10/31/2015 Document Reviewed: 06/29/2014 Elsevier Interactive Patient Education  2017 [employer].

## 2022-10-02 ENCOUNTER — Other Ambulatory Visit: Payer: Medicare Other

## 2022-10-12 ENCOUNTER — Telehealth: Payer: Self-pay | Admitting: Internal Medicine

## 2022-10-12 ENCOUNTER — Other Ambulatory Visit: Payer: Self-pay | Admitting: Internal Medicine

## 2022-10-12 DIAGNOSIS — I10 Essential (primary) hypertension: Secondary | ICD-10-CM

## 2022-10-12 NOTE — Telephone Encounter (Signed)
Pt needs an appt before any changes can be made to her medication.  KP

## 2022-10-12 NOTE — Telephone Encounter (Signed)
Copied from CRM [id number]. Topic: General - Inquiry >> Oct 12, 2022  3:07 PM Tonya Murray wrote: Reason for CRM: pt called the pharmacy for a refill on her Losartan.  She is asking that it be increased.  She says she is not feeling any effects.  CB#  [phone]

## 2022-10-13 ENCOUNTER — Ambulatory Visit (INDEPENDENT_AMBULATORY_CARE_PROVIDER_SITE_OTHER): Payer: Medicare Other | Admitting: Internal Medicine

## 2022-10-13 ENCOUNTER — Encounter: Payer: Self-pay | Admitting: Internal Medicine

## 2022-10-13 VITALS — BP 138/86 | HR 70 | Ht 68.0 in | Wt 158.0 lb

## 2022-10-13 DIAGNOSIS — G319 Degenerative disease of nervous system, unspecified: Secondary | ICD-10-CM

## 2022-10-13 DIAGNOSIS — I10 Essential (primary) hypertension: Secondary | ICD-10-CM

## 2022-10-13 DIAGNOSIS — I6523 Occlusion and stenosis of bilateral carotid arteries: Secondary | ICD-10-CM

## 2022-10-13 DIAGNOSIS — R6884 Jaw pain: Secondary | ICD-10-CM | POA: Diagnosis not present

## 2022-10-13 MED ORDER — LOSARTAN POTASSIUM 100 MG PO TABS: 100.0000 mg | ORAL_TABLET | Freq: Every day | ORAL | 1 refills | Status: DC

## 2022-10-13 NOTE — Assessment & Plan Note (Addendum)
Stable exam with poorly controlled BP.  Currently taking losartan. Tolerating medications without concerns or side effects. Increase losartan to 100 mg per day.  Recheck in 6 weeks

## 2022-10-13 NOTE — Patient Instructions (Signed)
-  It was a pleasure to see you today! Please review your visit summary for helpful information. -Lab results are usually available within 1-2 days and we will call once reviewed. -I would encourage you to follow your care via MyChart where you can access lab results, notes, messages, and more. -If you feel that we did a nice job today, please complete your after-visit survey and leave us a Google review! Your CMA today was Chassidy and your provider was Dr Jailynn Lavalais, MD.  

## 2022-10-13 NOTE — Assessment & Plan Note (Addendum)
Noted on CT done in 2020 for TIA like symptoms Small vessel ischemic disease also noted - will need tighter BP control.

## 2022-10-13 NOTE — Progress Notes (Signed)
Date:  10/13/2022   Name:  Tonya Murray   DOB:  [birth year]   MRN:  [id number]   Chief Complaint: Hypertension  Hypertension This is a chronic problem. The problem is controlled (145/80 at home). Associated symptoms include neck pain (right sided discomfort under the jaw). Pertinent negatives include no chest pain, headaches, palpitations or shortness of breath. Past treatments include angiotensin blockers (losartan 50 mg). The current treatment provides moderate improvement. There is no history of kidney disease, CAD/MI or CVA.  CAS - noted in 2022 < 40% bilaterally. Recommended aspirin, heart healthy diet.  Due for repeat imaging.  Lab Results  Component Value Date   NA 139 11/25/2021   K 4.8 11/25/2021   CO2 22 11/25/2021   GLUCOSE 105 (H) 11/25/2021   BUN 18 11/25/2021   CREATININE 0.96 11/25/2021   CALCIUM 10.0 11/25/2021   EGFR 66 11/25/2021   GFRNONAA >60 05/13/2018   Lab Results  Component Value Date   CHOL 258 (H) 05/11/2018   HDL 57 05/11/2018   LDLCALC 168 (H) 05/11/2018   TRIG 179 (H) 05/11/2018   CHOLHDL 4.5 05/11/2018   Lab Results  Component Value Date   TSH 2.600 11/25/2021   No results found for: "HGBA1C" Lab Results  Component Value Date   WBC 7.3 11/25/2021   HGB 13.6 11/25/2021   HCT 39.3 11/25/2021   MCV 86 11/25/2021   PLT 320 11/25/2021   Lab Results  Component Value Date   ALT 15 11/25/2021   AST 20 11/25/2021   ALKPHOS 107 11/25/2021   BILITOT 0.2 11/25/2021   No results found for: "25OHVITD2", "25OHVITD3", "VD25OH"   Review of Systems  Constitutional:  Negative for chills, fatigue and unexpected weight change.  HENT:  Negative for nosebleeds.   Eyes:  Negative for visual disturbance.  Respiratory:  Negative for cough, chest tightness, shortness of breath and wheezing.   Cardiovascular:  Negative for chest pain, palpitations and leg swelling.  Gastrointestinal:  Negative for abdominal pain, constipation and diarrhea.   Genitourinary:        Hot flashes  Musculoskeletal:  Positive for neck pain (right sided discomfort under the jaw).  Neurological:  Negative for dizziness, weakness, light-headedness and headaches.  Psychiatric/Behavioral:  Negative for dysphoric mood and sleep disturbance. The patient is not nervous/anxious.     Patient Active Problem List   Diagnosis Date Noted   Encounter for screening colonoscopy    Pharyngeal dysphagia 11/25/2021   GERD without esophagitis 11/25/2021   Status post hysterectomy 07/04/2018   History of closed head injury 06/21/2018   Hyperlipidemia LDL goal <70 06/10/2018   Cystocele, midline 05/24/2018   Cerebellar atrophy (HCC) 05/20/2018   Carotid atherosclerosis, bilateral 05/20/2018   Hx of completed stroke 05/20/2018   Family history of TIAs 05/11/2018   Essential hypertension 08/27/2009    Allergies  Allergen Reactions   Dilantin [Phenytoin Sodium Extended] Rash    Past Surgical History:  Procedure Laterality Date   ANTERIOR AND POSTERIOR REPAIR WITH SACROSPINOUS FIXATION N/A 05/26/2018   Procedure: ANTERIOR AND POSTERIOR REPAIR WITH SACROSPINOUS FIXATION;  Surgeon: Nadara Mustard, MD;  Location: [facility] ORS;  Service: Gynecology;  Laterality: N/A;   COLONOSCOPY WITH PROPOFOL N/A 02/24/2022   Procedure: COLONOSCOPY WITH PROPOFOL;  Surgeon: Toney Reil, MD;  Location: [facility] ENDOSCOPY;  Service: Gastroenterology;  Laterality: N/A;   DILATION AND CURETTAGE OF UTERUS     after one of her deliveries    TONSILLECTOMY  TRACHEOSTOMY CLOSURE  1980   VAGINAL HYSTERECTOMY Bilateral 05/26/2018   Procedure: HYSTERECTOMY VAGINAL;  Surgeon: Nadara Mustard, MD;  Location: [facility] ORS;  Service: Gynecology;  Laterality: Bilateral;    Social History   Tobacco Use   Smoking status: Former    Years: 4    Types: Cigarettes    Quit date: 05/13/1976    Years since quitting: 46.4   Smokeless tobacco: Never  Vaping Use   Vaping Use: Never used  Substance  Use Topics   Alcohol use: No   Drug use: Not Currently    Comment: prescribed     Medication list has been reviewed and updated.  Current Meds  Medication Sig   Fluocinolone Acetonide 0.01 % OIL SMARTSIG:2-4 Drop(s) In Ear(s) Twice a Week PRN   [DISCONTINUED] losartan (COZAAR) 50 MG tablet Take 1 tablet by mouth once daily       10/13/2022    8:36 AM 01/23/2022    2:48 PM 11/25/2021    3:14 PM  GAD 7 : Generalized Anxiety Score  Nervous, Anxious, on Edge 0 0 0  Control/stop worrying 0 0 0  Worry too much - different things 0 0 0  Trouble relaxing 0 0 1  Restless 0 0 0  Easily annoyed or irritable 0 1 1  Afraid - awful might happen 0 0 0  Total GAD 7 Score 0 1 2  Anxiety Difficulty Not difficult at all Not difficult at all Not difficult at all       10/13/2022    8:36 AM 08/05/2022   10:37 AM 01/23/2022    2:47 PM  Depression screen PHQ 2/9  Decreased Interest 0 0 1  Down, Depressed, Hopeless 0 0 0  PHQ - 2 Score 0 0 1  Altered sleeping 0 0 1  Tired, decreased energy 1 0 1  Change in appetite 0 0 0  Feeling bad or failure about yourself  0 0 0  Trouble concentrating 0 0 1  Moving slowly or fidgety/restless 0 0 0  Suicidal thoughts 0 0 0  PHQ-9 Score 1 0 4  Difficult doing work/chores Not difficult at all Not difficult at all Not difficult at all    BP Readings from Last 3 Encounters:  10/13/22 138/86  08/05/22 138/78  02/24/22 (!) 152/85    Physical Exam Vitals and nursing note reviewed.  Constitutional:      General: She is not in acute distress.    Appearance: Normal appearance. She is well-developed.  HENT:     Head: Normocephalic and atraumatic.  Neck:     Thyroid: No thyroid mass or thyroid tenderness.     Vascular: No carotid bruit.  Cardiovascular:     Rate and Rhythm: Normal rate and regular rhythm.     Heart sounds: No murmur heard. Pulmonary:     Effort: Pulmonary effort is normal. No respiratory distress.     Breath sounds: No wheezing or  rhonchi.  Musculoskeletal:     Cervical back: Normal range of motion. No rigidity. No spinous process tenderness. Normal range of motion.     Right lower leg: No edema.     Left lower leg: No edema.  Lymphadenopathy:     Cervical: No cervical adenopathy.  Skin:    General: Skin is warm and dry.     Capillary Refill: Capillary refill takes less than 2 seconds.     Findings: No rash.  Neurological:     General: No focal deficit present.  Mental Status: She is alert and oriented to person, place, and time.  Psychiatric:        Mood and Affect: Mood normal.        Behavior: Behavior normal.     Wt Readings from Last 3 Encounters:  10/13/22 158 lb (71.7 kg)  08/05/22 160 lb 3.2 oz (72.7 kg)  02/24/22 160 lb (72.6 kg)    BP 138/86 (BP Location: Right Arm, Cuff Size: Large)   Pulse 70   Ht 5\' 8"  (1.727 m)   Wt 158 lb (71.7 kg)   SpO2 97%   BMI 24.02 kg/m   Assessment and Plan:  Problem List Items Addressed This Visit       Cardiovascular and Mediastinum   Carotid atherosclerosis, bilateral (Chronic)    Due for repeat carotid US and lipids. Will make medication recommendations after imaging results      Relevant Medications   losartan (COZAAR) 100 MG tablet   Other Relevant Orders   Lipid panel   US Carotid Duplex Bilateral   Essential hypertension - Primary (Chronic)    Stable exam with poorly controlled BP.  Currently taking losartan. Tolerating medications without concerns or side effects. Increase losartan to 100 mg per day.  Recheck in 6 weeks       Relevant Medications   losartan (COZAAR) 100 MG tablet   Other Relevant Orders   CBC with Differential/Platelet   Comprehensive metabolic panel   TSH     Nervous and Auditory   Cerebellar atrophy (HCC) (Chronic)    Noted on CT done in 2020 for TIA like symptoms Small vessel ischemic disease also noted - will need tighter BP control.      Other Visit Diagnoses     Pain in lower jaw       suspect  small salivary gland stone - use peppermint or lemon drop to increase output       Return in about 6 weeks (around 11/24/2022) for HTN.   Partially dictated using Dragon software, any errors are not intentional.  Reubin Milan, MD [facility] [place], [place]

## 2022-10-13 NOTE — Assessment & Plan Note (Addendum)
Due for repeat carotid US and lipids. Will make medication recommendations after imaging results

## 2022-10-14 LAB — CBC WITH DIFFERENTIAL/PLATELET
Basophils Absolute: 0.1 10*3/uL (ref 0.0–0.2)
Basos: 1 %
EOS (ABSOLUTE): 0.1 10*3/uL (ref 0.0–0.4)
Eos: 1 %
Hematocrit: 37.6 % (ref 34.0–46.6)
Hemoglobin: 12.5 g/dL (ref 11.1–15.9)
Immature Grans (Abs): 0 10*3/uL (ref 0.0–0.1)
Immature Granulocytes: 0 %
Lymphocytes Absolute: 2.4 10*3/uL (ref 0.7–3.1)
Lymphs: 40 %
MCH: 29.3 pg (ref 26.6–33.0)
MCHC: 33.2 g/dL (ref 31.5–35.7)
MCV: 88 fL (ref 79–97)
Monocytes Absolute: 0.4 10*3/uL (ref 0.1–0.9)
Monocytes: 6 %
Neutrophils Absolute: 3.1 10*3/uL (ref 1.4–7.0)
Neutrophils: 52 %
Platelets: 321 10*3/uL (ref 150–450)
RBC: 4.26 x10E6/uL (ref 3.77–5.28)
RDW: 13 % (ref 11.7–15.4)
WBC: 6 10*3/uL (ref 3.4–10.8)

## 2022-10-14 LAB — LIPID PANEL
Chol/HDL Ratio: 4.6 ratio — ABNORMAL HIGH (ref 0.0–4.4)
Cholesterol, Total: 228 mg/dL — ABNORMAL HIGH (ref 100–199)
HDL: 50 mg/dL (ref >39)
LDL Chol Calc (NIH): 133 mg/dL — ABNORMAL HIGH (ref 0–99)
Triglycerides: 250 mg/dL — ABNORMAL HIGH (ref 0–149)
VLDL Cholesterol Cal: 45 mg/dL — ABNORMAL HIGH (ref 5–40)

## 2022-10-14 LAB — COMPREHENSIVE METABOLIC PANEL
ALT: 19 IU/L (ref 0–32)
AST: 23 IU/L (ref 0–40)
Albumin/Globulin Ratio: 1.5 (ref 1.2–2.2)
Albumin: 4.3 g/dL (ref 3.9–4.9)
Alkaline Phosphatase: 100 IU/L (ref 44–121)
BUN/Creatinine Ratio: 19 (ref 12–28)
BUN: 15 mg/dL (ref 8–27)
Bilirubin Total: 0.3 mg/dL (ref 0.0–1.2)
CO2: 21 mmol/L (ref 20–29)
Calcium: 9.4 mg/dL (ref 8.7–10.3)
Chloride: 103 mmol/L (ref 96–106)
Creatinine, Ser: 0.81 mg/dL (ref 0.57–1.00)
Globulin, Total: 2.8 g/dL (ref 1.5–4.5)
Glucose: 89 mg/dL (ref 70–99)
Potassium: 4.1 mmol/L (ref 3.5–5.2)
Sodium: 139 mmol/L (ref 134–144)
Total Protein: 7.1 g/dL (ref 6.0–8.5)
eGFR: 80 mL/min/{1.73_m2} (ref >59)

## 2022-10-14 LAB — TSH: TSH: 1.93 u[IU]/mL (ref 0.450–4.500)

## 2022-10-20 ENCOUNTER — Ambulatory Visit: Payer: Medicare Other | Attending: Internal Medicine

## 2022-10-26 ENCOUNTER — Ambulatory Visit
Admission: RE | Admit: 2022-10-26 | Discharge: 2022-10-26 | Disposition: A | Discharge status: Home / Self Care | Payer: Medicare Other | Source: Ambulatory Visit | Attending: Internal Medicine | Admitting: Internal Medicine

## 2022-10-26 DIAGNOSIS — I6523 Occlusion and stenosis of bilateral carotid arteries: Secondary | ICD-10-CM | POA: Diagnosis not present

## 2022-10-28 ENCOUNTER — Ambulatory Visit: Payer: Self-pay | Admitting: *Deleted

## 2022-10-28 NOTE — Telephone Encounter (Signed)
Reason for Disposition  [1] Follow-up call to recent contact AND [2] information only call, no triage required  Answer Assessment - Initial Assessment Questions 1. REASON FOR CALL or QUESTION: "What is your reason for calling today?" or "How can I best help you?" or "What question do you have that I can help answer?"     Pt called in for her U/S result of her carotid arteries.   Message from Dr. Judithann Graves dated 10/26/2022 at 12:39 PM given.  She cannot take statin medications.   She will talk with Dr. Judithann Graves about it.   No questions.  Protocols used: Information Only Call - No Triage-A-AH

## 2022-10-28 NOTE — Telephone Encounter (Signed)
  Chief Complaint: Called in for and was given her U/S of her carotid arteries message from Dr Judithann Graves. Symptoms: N/A Frequency: N/A Pertinent Negatives: Patient denies N/A Disposition: [] ED /[] Urgent Care (no appt availability in office) / [] Appointment(In office/virtual)/ []  [facility]/ [] Home Care/ [] Refused Recommended Disposition /[] [place] [facility]/ [x]  Follow-up with PCP Additional Notes: She's going to talk with Dr. Judithann Graves about the fact she can't take statin drugs.   She has tried them in the past.

## 2022-10-29 NOTE — Telephone Encounter (Signed)
FYI- Pt stated she can't take statin drugs. She has tried them in the past.  KP

## 2022-10-30 NOTE — Telephone Encounter (Signed)
Called patient and left VM asking her to call back to discuss.  - Tonya Murray

## 2023-06-21 ENCOUNTER — Other Ambulatory Visit: Payer: Self-pay | Admitting: Internal Medicine

## 2023-06-21 DIAGNOSIS — I10 Essential (primary) hypertension: Secondary | ICD-10-CM

## 2023-06-22 NOTE — Telephone Encounter (Signed)
 OV needed for additional refills, 30 day supply given until OV can be made.  Requested Prescriptions  Pending Prescriptions Disp Refills   losartan  (COZAAR ) 100 MG tablet [Pharmacy Med Name: LOSARTAN  POTASSIUM 100 MG TAB] 30 tablet 0    Sig: TAKE 1 TABLET BY MOUTH EVERY DAY     Cardiovascular:  Angiotensin Receptor Blockers Failed - 06/22/2023 12:27 PM      Failed - Cr in normal range and within 180 days    Creat  Date Value Ref Range Status  05/11/2018 0.82 0.50 - 0.99 mg/dL Final    Comment:    For patients >61 years of age, the reference limit for Creatinine is approximately 13% higher for people identified as African-American. .    Creatinine, Ser  Date Value Ref Range Status  10/13/2022 0.81 0.57 - 1.00 mg/dL Final         Failed - K in normal range and within 180 days    Potassium  Date Value Ref Range Status  10/13/2022 4.1 3.5 - 5.2 mmol/L Final         Failed - Valid encounter within last 6 months    Recent Outpatient Visits           8 months ago Essential hypertension   [facility] [facility], Leita DEL, MD   1 year ago Essential hypertension   [place] [facility], Leita DEL, MD   1 year ago Essential hypertension   [place] [facility], Leita DEL, MD   5 years ago Carotid atherosclerosis, bilateral   [facility], Newell SQUIBB, MD   5 years ago Essential hypertension, benign   [facility], Newell SQUIBB, MD              Passed - Patient is not pregnant      Passed - Last BP in normal range    BP Readings from Last 1 Encounters:  10/13/22 138/86

## 2023-07-12 ENCOUNTER — Telehealth: Payer: Self-pay | Admitting: Internal Medicine

## 2023-07-12 NOTE — Telephone Encounter (Signed)
Copied from CRM [id number]. Topic: Medicare AWV >> Jul 12, 2023 10:48 AM Payton Doughty wrote: Reason for CRM: LVM 07/12/2023 to r/s 08/11/23 AWV appt due to [facility] sched change [facility]  Verlee Rossetti; Care Guide Ambulatory Clinical Support [place] [facility] Direct Dial: [phone]

## 2023-07-18 ENCOUNTER — Other Ambulatory Visit: Payer: Self-pay | Admitting: Internal Medicine

## 2023-07-18 DIAGNOSIS — I10 Essential (primary) hypertension: Secondary | ICD-10-CM

## 2023-07-19 NOTE — Telephone Encounter (Signed)
 Requested medications are due for refill today.  yes  Requested medications are on the active medications list.  yes  Last refill. 06/22/2023 #30 0 rf  Future visit scheduled.   no  Notes to clinic.  Courtesy refill already given. Please advise.    Requested Prescriptions  Pending Prescriptions Disp Refills   losartan  (COZAAR ) 100 MG tablet [Pharmacy Med Name: LOSARTAN  POTASSIUM 100 MG TAB] 90 tablet 1    Sig: TAKE 1 TABLET BY MOUTH EVERY DAY     Cardiovascular:  Angiotensin Receptor Blockers Failed - 07/19/2023  5:05 PM      Failed - Cr in normal range and within 180 days    Creat  Date Value Ref Range Status  05/11/2018 0.82 0.50 - 0.99 mg/dL Final    Comment:    For patients >88 years of age, the reference limit for Creatinine is approximately 13% higher for people identified as African-American. .    Creatinine, Ser  Date Value Ref Range Status  10/13/2022 0.81 0.57 - 1.00 mg/dL Final         Failed - K in normal range and within 180 days    Potassium  Date Value Ref Range Status  10/13/2022 4.1 3.5 - 5.2 mmol/L Final         Failed - Valid encounter within last 6 months    Recent Outpatient Visits           9 months ago Essential hypertension   [place] [facility], Chales [place], MD   1 year ago Essential hypertension   [place] [facility] at [facility], Chales [place], MD   1 year ago Essential hypertension   [place] [facility] at [facility], Chales [place], MD   5 years ago Carotid atherosclerosis, bilateral   [facility], Ernestine Heads, MD   5 years ago Essential hypertension, benign   [facility], Ernestine Heads, MD              Passed - Patient is not pregnant      Passed - Last BP in normal range    BP Readings from Last 1 Encounters:  10/13/22 138/86

## 2023-07-22 ENCOUNTER — Other Ambulatory Visit: Payer: Self-pay | Admitting: Internal Medicine

## 2023-07-22 DIAGNOSIS — I10 Essential (primary) hypertension: Secondary | ICD-10-CM

## 2023-07-23 NOTE — Telephone Encounter (Signed)
Requested medication (s) are due for refill today: yes  Requested medication (s) are on the active medication list: yes  Last refill:  06/22/23  Future visit scheduled: no  Notes to clinic:  Unable to refill per protocol, courtesy refill already given, routing for provider approval.      Requested Prescriptions  Pending Prescriptions Disp Refills   losartan (COZAAR) 100 MG tablet [Pharmacy Med Name: LOSARTAN POTASSIUM 100 MG TAB] 30 tablet 0    Sig: TAKE 1 TABLET BY MOUTH EVERY DAY     Cardiovascular:  Angiotensin Receptor Blockers Failed - 07/23/2023  9:36 AM      Failed - Cr in normal range and within 180 days    Creat  Date Value Ref Range Status  05/11/2018 0.82 0.50 - 0.99 mg/dL Final    Comment:    For patients >33 years of age, the reference limit for Creatinine is approximately 13% higher for people identified as African-American. .    Creatinine, Ser  Date Value Ref Range Status  10/13/2022 0.81 0.57 - 1.00 mg/dL Final         Failed - K in normal range and within 180 days    Potassium  Date Value Ref Range Status  10/13/2022 4.1 3.5 - 5.2 mmol/L Final         Failed - Valid encounter within last 6 months    Recent Outpatient Visits           9 months ago Essential hypertension   [place] [facility] at [facility], Nyoka Cowden, MD   1 year ago Essential hypertension   [place] [facility] at [facility], Nyoka Cowden, MD   1 year ago Essential hypertension   [place] [facility] at [facility], Nyoka Cowden, MD   5 years ago Carotid atherosclerosis, bilateral   [facility], Janit Bern, MD   5 years ago Essential hypertension, benign   [facility] [place], Janit Bern, MD              Passed - Patient is not pregnant      Passed - Last BP in normal range    BP Readings from Last 1 Encounters:   10/13/22 138/86

## 2023-08-11 ENCOUNTER — Other Ambulatory Visit: Payer: Self-pay | Admitting: Internal Medicine

## 2023-08-11 DIAGNOSIS — I10 Essential (primary) hypertension: Secondary | ICD-10-CM

## 2023-08-11 NOTE — Telephone Encounter (Signed)
 Copied from CRM [id number]. Topic: Clinical - Medication Refill >> Aug 11, 2023 11:08 AM Antwanette L wrote: Most Recent Primary Care Visit:  Provider: Reubin Milan  Department: [facility]  Visit Type: OFFICE VISIT  Date: 10/13/2022  Medication: losartan (COZAAR) 100 MG tablet  Has the patient contacted their pharmacy? No   Is this the correct pharmacy for this prescription? Yes  This is the patient's preferred pharmacy:  [id number] [place] [place] - [address] [place] [address] Phone: [phone] Fax: [phone]   Has the prescription been filled recently? No  Is the patient out of the medication? No  Has the patient been seen for an appointment in the last year OR does the patient have an upcoming appointment? Yes  Can we respond through MyChart? No. Contact patient by phone at [phone]   Agent: Please be advised that Rx refills may take up to 3 business days. We ask that you follow-up with your pharmacy.

## 2023-08-12 NOTE — Telephone Encounter (Signed)
 Requested medication (s) are due for refill today:   Yes  Requested medication (s) are on the active medication list:   Yes  Future visit scheduled:   No.   LOV 10/13/2022.   I called and let her know she needed to schedule an appt for further refills since she had her 30 day courtesy refill on 06/22/2023 #30, 0 refills.    She wasn't at home so did not have access to her work schedule.   "I will call back as soon as I get home and schedule".      Last ordered: 06/22/2023 #30, 0 refills   courtesy refill  Returned because OV and labs are due   Requested Prescriptions  Pending Prescriptions Disp Refills   losartan (COZAAR) 100 MG tablet 30 tablet 0    Sig: Take 1 tablet (100 mg total) by mouth daily.     Cardiovascular:  Angiotensin Receptor Blockers Failed - 08/12/2023 10:34 AM      Failed - Cr in normal range and within 180 days    Creat  Date Value Ref Range Status  05/11/2018 0.82 0.50 - 0.99 mg/dL Final    Comment:    For patients >20 years of age, the reference limit for Creatinine is approximately 13% higher for people identified as African-American. .    Creatinine, Ser  Date Value Ref Range Status  10/13/2022 0.81 0.57 - 1.00 mg/dL Final         Failed - K in normal range and within 180 days    Potassium  Date Value Ref Range Status  10/13/2022 4.1 3.5 - 5.2 mmol/L Final         Failed - Valid encounter within last 6 months    Recent Outpatient Visits           10 months ago Essential hypertension   [place] [facility], Nyoka Cowden, MD   1 year ago Essential hypertension   [place] [facility] at [facility], Nyoka Cowden, MD   1 year ago Essential hypertension   [place] [facility] at [facility], Nyoka Cowden, MD   5 years ago Carotid atherosclerosis, bilateral   [facility], Janit Bern, MD   5 years ago Essential  hypertension, benign   [facility] [place], Janit Bern, MD              Passed - Patient is not pregnant      Passed - Last BP in normal range    BP Readings from Last 1 Encounters:  10/13/22 138/86

## 2023-08-30 ENCOUNTER — Ambulatory Visit
Admission: RE | Admit: 2023-08-30 | Discharge: 2023-08-30 | Disposition: A | Discharge status: Home / Self Care | Source: Ambulatory Visit | Attending: Internal Medicine | Admitting: Internal Medicine

## 2023-08-30 ENCOUNTER — Other Ambulatory Visit
Admission: RE | Admit: 2023-08-30 | Discharge: 2023-08-30 | Disposition: A | Discharge status: Home / Self Care | Source: Home / Self Care | Attending: Internal Medicine | Admitting: Internal Medicine

## 2023-08-30 ENCOUNTER — Ambulatory Visit (INDEPENDENT_AMBULATORY_CARE_PROVIDER_SITE_OTHER): Payer: Self-pay | Admitting: Internal Medicine

## 2023-08-30 ENCOUNTER — Encounter: Payer: Self-pay | Admitting: Internal Medicine

## 2023-08-30 ENCOUNTER — Ambulatory Visit
Admission: RE | Admit: 2023-08-30 | Discharge: 2023-08-30 | Disposition: A | Discharge status: Home / Self Care | Attending: Internal Medicine | Admitting: Internal Medicine

## 2023-08-30 ENCOUNTER — Other Ambulatory Visit: Payer: Self-pay | Admitting: Internal Medicine

## 2023-08-30 VITALS — BP 126/77 | HR 63 | Ht 68.0 in | Wt 159.4 lb

## 2023-08-30 DIAGNOSIS — G8929 Other chronic pain: Secondary | ICD-10-CM | POA: Diagnosis not present

## 2023-08-30 DIAGNOSIS — I10 Essential (primary) hypertension: Secondary | ICD-10-CM

## 2023-08-30 DIAGNOSIS — Z0000 Encounter for general adult medical examination without abnormal findings: Secondary | ICD-10-CM

## 2023-08-30 DIAGNOSIS — M25561 Pain in right knee: Secondary | ICD-10-CM | POA: Insufficient documentation

## 2023-08-30 DIAGNOSIS — M1711 Unilateral primary osteoarthritis, right knee: Secondary | ICD-10-CM | POA: Diagnosis not present

## 2023-08-30 DIAGNOSIS — E785 Hyperlipidemia, unspecified: Secondary | ICD-10-CM

## 2023-08-30 DIAGNOSIS — Z1231 Encounter for screening mammogram for malignant neoplasm of breast: Secondary | ICD-10-CM

## 2023-08-30 LAB — CBC WITH DIFFERENTIAL/PLATELET
Abs Immature Granulocytes: 0.01 10*3/uL (ref 0.00–0.07)
Basophils Absolute: 0.1 10*3/uL (ref 0.0–0.1)
Basophils Relative: 1 %
Eosinophils Absolute: 0.2 10*3/uL (ref 0.0–0.5)
Eosinophils Relative: 3 %
HCT: 36.2 % (ref 36.0–46.0)
Hemoglobin: 11.9 g/dL — ABNORMAL LOW (ref 12.0–15.0)
Immature Granulocytes: 0 %
Lymphocytes Relative: 34 %
Lymphs Abs: 2.2 10*3/uL (ref 0.7–4.0)
MCH: 29.3 pg (ref 26.0–34.0)
MCHC: 32.9 g/dL (ref 30.0–36.0)
MCV: 89.2 fL (ref 80.0–100.0)
Monocytes Absolute: 0.5 10*3/uL (ref 0.1–1.0)
Monocytes Relative: 8 %
Neutro Abs: 3.4 10*3/uL (ref 1.7–7.7)
Neutrophils Relative %: 54 %
Platelets: 329 10*3/uL (ref 150–400)
RBC: 4.06 MIL/uL (ref 3.87–5.11)
RDW: 13.2 % (ref 11.5–15.5)
WBC: 6.4 10*3/uL (ref 4.0–10.5)
nRBC: 0 % (ref 0.0–0.2)

## 2023-08-30 LAB — COMPREHENSIVE METABOLIC PANEL
ALT: 15 U/L (ref 0–44)
AST: 21 U/L (ref 15–41)
Albumin: 3.9 g/dL (ref 3.5–5.0)
Alkaline Phosphatase: 74 U/L (ref 38–126)
Anion gap: 9 (ref 5–15)
BUN: 20 mg/dL (ref 8–23)
CO2: 23 mmol/L (ref 22–32)
Calcium: 9.2 mg/dL (ref 8.9–10.3)
Chloride: 105 mmol/L (ref 98–111)
Creatinine, Ser: 0.85 mg/dL (ref 0.44–1.00)
GFR, Estimated: >60 mL/min (ref >60)
Glucose, Bld: 89 mg/dL (ref 70–99)
Potassium: 4.2 mmol/L (ref 3.5–5.1)
Sodium: 137 mmol/L (ref 135–145)
Total Bilirubin: 0.6 mg/dL (ref 0.0–1.2)
Total Protein: 7.2 g/dL (ref 6.5–8.1)

## 2023-08-30 LAB — TSH: TSH: 2.462 u[IU]/mL (ref 0.350–4.500)

## 2023-08-30 MED ORDER — LOSARTAN POTASSIUM 100 MG PO TABS
100.0000 mg | ORAL_TABLET | Freq: Every day | ORAL | 1 refills | Status: DC
Start: 2023-08-30 — End: 2023-08-30

## 2023-08-30 NOTE — Patient Instructions (Signed)
 Call [facility] to schedule your mammogram at [phone].

## 2023-08-30 NOTE — Assessment & Plan Note (Addendum)
 Continue soft brace as needed and otc anti-inflammatories. Will obtain imaging and advise
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
## 2023-08-30 NOTE — Assessment & Plan Note (Addendum)
 Cholesterol managed with diet only - pt declines statin therapy. The 10-year ASCVD risk score (Arnett DK, et al., 2019) is: 11.6%   Values used to calculate the score:     Age: 68 years     Sex: Female     Is Non-Hispanic African American: No     Diabetic: No     Tobacco smoker: No     Systolic Blood Pressure: 138 mmHg     Is BP treated: Yes     HDL Cholesterol: 50 mg/dL     Total Cholesterol: 228 mg/dL

## 2023-08-30 NOTE — Assessment & Plan Note (Signed)
 Blood pressures is well controlled.  Current medications losartan. Will continue same regimen along with efforts to limit dietary sodium.

## 2023-08-30 NOTE — Telephone Encounter (Signed)
 Copied from CRM [id number]. Topic: Clinical - Medication Refill >> Aug 30, 2023 10:12 AM Nada Libman H wrote: Most Recent Primary Care Visit:  Provider: Reubin Milan  Department: [facility]  Visit Type: PHYSICAL  Date: 08/30/2023  Medication: losartan (COZAAR) 100 MG tablet [[id number]]  Has the patient contacted their pharmacy? No (Agent: If no, request that the patient contact the pharmacy for the refill. If patient does not wish to contact the pharmacy document the reason why and proceed with request.) (Agent: If yes, when and what did the pharmacy advise?) Prescription was sent to the wrong pharmacy  Is this the correct pharmacy for this prescription? Yes If no, delete pharmacy and type the correct one.  This is the patient's preferred pharmacy:  [id number] [place], [place] - [address] [place] [address] Phone: [phone] Fax: [phone]   Has the prescription been filled recently? No  Is the patient out of the medication? Yes  Has the patient been seen for an appointment in the last year OR does the patient have an upcoming appointment? Yes  Can we respond through MyChart? No  Agent: Please be advised that Rx refills may take up to 3 business days. We ask that you follow-up with your pharmacy.

## 2023-08-31 MED ORDER — LOSARTAN POTASSIUM 100 MG PO TABS: 100.0000 mg | ORAL_TABLET | Freq: Every day | ORAL | 1 refills | Status: DC

## 2023-08-31 NOTE — Telephone Encounter (Signed)
 Called [facility] in attempts to confirm patient did not pick up Rx. [facility] place NT on hold greater than 6-7 minutes , NT unable to hold. Did not confirm if patient has picked up Rx . Receipt was confirmed from pharmacy Rx request was sent on 08/30/23 at 8:42 am.

## 2023-08-31 NOTE — Telephone Encounter (Signed)
 Called patient to confirm which pharmacy she would like for Rx to be sent to. Patient reports she gave PCP wrong pharmacy [facility]

## 2023-08-31 NOTE — Telephone Encounter (Signed)
 Requested by interface surescripts. Last OV 08/30/23. Requested Prescriptions  Pending Prescriptions Disp Refills   losartan (COZAAR) 100 MG tablet 90 tablet 1    Sig: Take 1 tablet (100 mg total) by mouth daily.     Cardiovascular:  Angiotensin Receptor Blockers Failed - 08/31/2023  3:11 PM      Failed - Valid encounter within last 6 months    Recent Outpatient Visits           10 months ago Essential hypertension   [place] [facility], Nyoka Cowden, MD   1 year ago Essential hypertension   [place] [facility] at [facility], Nyoka Cowden, MD   1 year ago Essential hypertension   [place] [facility] at [facility], Nyoka Cowden, MD   5 years ago Carotid atherosclerosis, bilateral   [facility], Janit Bern, MD   5 years ago Essential hypertension, benign   [facility], Janit Bern, MD       Future Appointments             In 5 months Reubin Milan, MD [facility] at [facility]            Passed - Cr in normal range and within 180 days    Creat  Date Value Ref Range Status  05/11/2018 0.82 0.50 - 0.99 mg/dL Final    Comment:    For patients >55 years of age, the reference limit for Creatinine is approximately 13% higher for people identified as African-American. .    Creatinine, Ser  Date Value Ref Range Status  08/30/2023 0.85 0.44 - 1.00 mg/dL Final         Passed - K in normal range and within 180 days    Potassium  Date Value Ref Range Status  08/30/2023 4.2 3.5 - 5.1 mmol/L Final         Passed - Patient is not pregnant      Passed - Last BP in normal range    BP Readings from Last 1 Encounters:  08/30/23 126/77

## 2023-09-02 ENCOUNTER — Telehealth: Payer: Self-pay | Admitting: Internal Medicine

## 2023-09-02 LAB — LIPID PANEL
Cholesterol: 246 mg/dL — ABNORMAL HIGH (ref 0–200)
HDL: 55 mg/dL (ref >40)
LDL Cholesterol: 154 mg/dL — ABNORMAL HIGH (ref 0–99)
Total CHOL/HDL Ratio: 4.5 ratio
Triglycerides: 183 mg/dL — ABNORMAL HIGH (ref <150)
VLDL: 37 mg/dL (ref 0–40)

## 2023-09-02 NOTE — Telephone Encounter (Signed)
 Copied from CRM [id number]. Topic: Clinical - Prescription Issue >> Sep 02, 2023  9:59 AM Priscille Loveless wrote: Reason for CRM: losartan (COZAAR) 100 MG tablet  Pt is stating that the pharmacy is stating that they have not received this but in the system it shows that it was sent. Please advise.

## 2023-09-02 NOTE — Telephone Encounter (Signed)
 [facility] and spoke to Vern[facility] regarding the losartan. She says it's showing it was filled by another pharmacy and it's not showing due per insurance until June. It shows it was sent to [facility]. She says to call [facility] and have them cancel the Rx. I called the patient to see if she picked it up from [facility], left VM to return the call to the office. [facility] called and spoke to [employer], Pensions consultant and she says it's not showing, so she transferred me to the store to check with them. Spoke to [employer], Pensions consultant, she says it's there under the patient's maiden name. I advised [facility], so she closed it out so that [facility]. [facility] Gabriel Rung, [facility] and advised of the above. He says it went through and should be ready in 1 hour.

## 2023-09-07 ENCOUNTER — Telehealth: Payer: Self-pay

## 2023-09-07 NOTE — Telephone Encounter (Signed)
 Copied from CRM [id number]. Topic: Clinical - Lab/Test Results >> Sep 07, 2023  2:31 PM Tiffany B wrote: Reason for CRM Patient inquiring about her imaging results and would like a call back.

## 2023-09-08 ENCOUNTER — Other Ambulatory Visit: Payer: Self-pay | Admitting: Internal Medicine

## 2023-09-08 DIAGNOSIS — M1711 Unilateral primary osteoarthritis, right knee: Secondary | ICD-10-CM

## 2023-09-08 NOTE — Progress Notes (Unsigned)
 Date:  09/08/2023   Name:  Tonya Murray   DOB:  17-Sep-1955   MRN:  [id number]   Chief Complaint: No chief complaint on file.  HPI  Review of Systems   Lab Results  Component Value Date   NA 137 08/30/2023   K 4.2 08/30/2023   CO2 23 08/30/2023   GLUCOSE 89 08/30/2023   BUN 20 08/30/2023   CREATININE 0.85 08/30/2023   CALCIUM 9.2 08/30/2023   EGFR 80 10/13/2022   GFRNONAA >60 08/30/2023   Lab Results  Component Value Date   CHOL 246 (H) 08/30/2023   HDL 55 08/30/2023   LDLCALC 154 (H) 08/30/2023   TRIG 183 (H) 08/30/2023   CHOLHDL 4.5 08/30/2023   Lab Results  Component Value Date   TSH 2.462 08/30/2023   No results found for: "HGBA1C" Lab Results  Component Value Date   WBC 6.4 08/30/2023   HGB 11.9 (L) 08/30/2023   HCT 36.2 08/30/2023   MCV 89.2 08/30/2023   PLT 329 08/30/2023   Lab Results  Component Value Date   ALT 15 08/30/2023   AST 21 08/30/2023   ALKPHOS 74 08/30/2023   BILITOT 0.6 08/30/2023   No results found for: "25OHVITD2", "25OHVITD3", "VD25OH"   Patient Active Problem List   Diagnosis Date Noted   Chronic pain of right knee 08/30/2023   Encounter for screening colonoscopy    Pharyngeal dysphagia 11/25/2021   GERD without esophagitis 11/25/2021   Status post hysterectomy 07/04/2018   History of closed head injury 06/21/2018   Hyperlipidemia LDL goal <70 06/10/2018   Cystocele, midline 05/24/2018   Cerebellar atrophy (HCC) 05/20/2018   Carotid atherosclerosis, bilateral 05/20/2018   Hx of completed stroke 05/20/2018   Family history of TIAs 05/11/2018   Essential hypertension 08/27/2009    Allergies  Allergen Reactions   Dilantin [Phenytoin Sodium Extended] Rash    Past Surgical History:  Procedure Laterality Date   ANTERIOR AND POSTERIOR REPAIR WITH SACROSPINOUS FIXATION N/A 05/26/2018   Procedure: ANTERIOR AND POSTERIOR REPAIR WITH SACROSPINOUS FIXATION;  Surgeon: Nadara Mustard, MD;  Location: [facility] ORS;   Service: Gynecology;  Laterality: N/A;   COLONOSCOPY WITH PROPOFOL N/A 02/24/2022   Procedure: COLONOSCOPY WITH PROPOFOL;  Surgeon: Toney Reil, MD;  Location: [facility] ENDOSCOPY;  Service: Gastroenterology;  Laterality: N/A;   DILATION AND CURETTAGE OF UTERUS     after one of her deliveries    TONSILLECTOMY     TRACHEOSTOMY CLOSURE  1980   VAGINAL HYSTERECTOMY Bilateral 05/26/2018   Procedure: HYSTERECTOMY VAGINAL;  Surgeon: Nadara Mustard, MD;  Location: [facility] ORS;  Service: Gynecology;  Laterality: Bilateral;    Social History   Tobacco Use   Smoking status: Former    Current packs/day: 0.00    Types: Cigarettes    Start date: 05/13/1972    Quit date: 05/13/1976    Years since quitting: 47.3   Smokeless tobacco: Never  Vaping Use   Vaping status: Never Used  Substance Use Topics   Alcohol use: No   Drug use: Not Currently    Comment: prescribed     Medication list has been reviewed and updated.  No outpatient medications have been marked as taking for the 09/08/23 encounter (Orders Only) with Reubin Milan, MD.       08/30/2023    8:26 AM 10/13/2022    8:36 AM 01/23/2022    2:48 PM 11/25/2021    3:14 PM  GAD 7 : Generalized  Anxiety Score  Nervous, Anxious, on Edge 0 0 0 0  Control/stop worrying 0 0 0 0  Worry too much - different things 0 0 0 0  Trouble relaxing 0 0 0 1  Restless 0 0 0 0  Easily annoyed or irritable 0 0 1 1  Afraid - awful might happen 0 0 0 0  Total GAD 7 Score 0 0 1 2  Anxiety Difficulty Not difficult at all Not difficult at all Not difficult at all Not difficult at all       08/30/2023    8:25 AM 10/13/2022    8:36 AM 08/05/2022   10:37 AM  Depression screen PHQ 2/9  Decreased Interest 0 0 0  Down, Depressed, Hopeless 0 0 0  PHQ - 2 Score 0 0 0  Altered sleeping 0 0 0  Tired, decreased energy 1 1 0  Change in appetite 0 0 0  Feeling bad or failure about yourself  0 0 0  Trouble concentrating 0 0 0  Moving slowly or fidgety/restless  0 0 0  Suicidal thoughts 0 0 0  PHQ-9 Score 1 1 0  Difficult doing work/chores Not difficult at all Not difficult at all Not difficult at all    BP Readings from Last 3 Encounters:  08/30/23 126/77  10/13/22 138/86  08/05/22 138/78    Physical Exam  Wt Readings from Last 3 Encounters:  08/30/23 159 lb 6 oz (72.3 kg)  10/13/22 158 lb (71.7 kg)  08/05/22 160 lb 3.2 oz (72.7 kg)    There were no vitals taken for this visit.  Assessment and Plan:  Problem List Items Addressed This Visit   None   No follow-ups on file.    Reubin Milan, MD [facility] [place]

## 2023-09-08 NOTE — Telephone Encounter (Signed)
 Spoke with patient about her results. She verbalized understanding.  Would like to see Tonya Murray. Everlene Other, MD at [facility] in [place], [place], states her husband has seen him before and would like to be referred to his office.

## 2023-09-14 DIAGNOSIS — M1711 Unilateral primary osteoarthritis, right knee: Secondary | ICD-10-CM | POA: Diagnosis not present

## 2023-09-15 ENCOUNTER — Encounter

## 2023-12-02 LAB — GLUCOSE, POCT (MANUAL RESULT ENTRY): POC Glucose: 95 mg/dL (ref 70–99)

## 2023-12-02 NOTE — Congregational Nurse Program (Signed)
  Dept: [phone]   Congregational Nurse Program Note  Date of Encounter: 12/02/2023  Past Medical History: 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   HTN (hypertension)    Hyperlipidemia    Normal cardiac stress test 2000s   normal with palps   Postmenopausal     Encounter Details:  Community Questionnaire - 12/02/23 0953       Questionnaire   Ask client: Do you give verbal consent for me to treat you today? Yes    Student Assistance N/A    Location Patient Served  [facility]    Encounter Setting CN site    Population Status Unknown    Insurance Medicare    Insurance/Financial Assistance Referral N/A    Medication N/A    Medical Provider Yes    Screening Referrals Made N/A    Medical Referrals Made N/A    Medical Appointment Completed N/A    CNP Interventions N/A    Screenings CN Performed Blood Pressure;Blood Glucose    ED Visit Averted N/A    Life-Saving Intervention Made N/A          Today's Vitals   12/02/23 0952  BP: 138/79   There is no height or weight on file to calculate BMI.

## 2023-12-07 ENCOUNTER — Telehealth: Payer: Self-pay | Admitting: Internal Medicine

## 2023-12-07 DIAGNOSIS — T17308A Unspecified foreign body in larynx causing other injury, initial encounter: Secondary | ICD-10-CM

## 2023-12-07 NOTE — Telephone Encounter (Signed)
 Pt called back. Spoke with patient. She is c/o swallowing issues. She said her throat feels like its contracting and she gets choked easily. She said she is a fast eater, and has to take her time to chew her food very small to be able to swallow it. She said the first time was about a month ago. Pt was eating rice at that time, and she choked. Pt has a tracheostomy closure in 1980. Will place referral per Dr Justus to GI for choking and see if she can be seen soon with GI at [facility].  - Erendida Wrenn M.

## 2023-12-07 NOTE — Telephone Encounter (Signed)
 Tried to call pt. Left VM for her to call back and discuss further. Told her on VM that if its a new problem she will need to be seen first, before we can place a referral.

## 2023-12-07 NOTE — Telephone Encounter (Signed)
 Copied from CRM [id number]. Topic: Clinical - Medical Advice >> Dec 07, 2023 10:51 AM Tiffini S wrote: Reason for CRM: Patient states that she needs a referral but have questions/ concerns and need to speak with Dr. Justus or her nurse at  [phone]. Patient was not able to say what her concerns where at the time of the call. Will go into details with PCP.

## 2023-12-09 ENCOUNTER — Ambulatory Visit (INDEPENDENT_AMBULATORY_CARE_PROVIDER_SITE_OTHER): Admitting: Internal Medicine

## 2023-12-09 ENCOUNTER — Encounter: Payer: Self-pay | Admitting: Internal Medicine

## 2023-12-09 VITALS — BP 122/70 | HR 75 | Ht 68.0 in | Wt 155.0 lb

## 2023-12-09 DIAGNOSIS — R1313 Dysphagia, pharyngeal phase: Secondary | ICD-10-CM | POA: Diagnosis not present

## 2023-12-09 DIAGNOSIS — I10 Essential (primary) hypertension: Secondary | ICD-10-CM | POA: Diagnosis not present

## 2023-12-09 NOTE — Progress Notes (Signed)
 Date:  12/09/2023   Name:  Tonya Murray   DOB:  [birth year]   MRN:  [id number]   Chief Complaint: Dysphagia  HPI Dysphagia - first severe episode 2 months ago with rice.  Had to regurgitate and called EMS.  Was able to clear the food. Last night had another spell last night while eating bread.  She was able to stop eating and it eventually passed. She has no issues with liquids or soft foods like yogurt.  Review of Systems  Constitutional:  Negative for chills, fatigue and fever.  HENT:  Positive for trouble swallowing.   Respiratory:  Negative for chest tightness and shortness of breath.   Cardiovascular:  Negative for chest pain.  Gastrointestinal:  Negative for abdominal pain.  Psychiatric/Behavioral:  Negative for dysphoric mood and sleep disturbance.      Lab Results  Component Value Date   NA 137 08/30/2023   K 4.2 08/30/2023   CO2 23 08/30/2023   GLUCOSE 89 08/30/2023   BUN 20 08/30/2023   CREATININE 0.85 08/30/2023   CALCIUM  9.2 08/30/2023   EGFR 80 10/13/2022   GFRNONAA >60 08/30/2023   Lab Results  Component Value Date   CHOL 246 (H) 08/30/2023   HDL 55 08/30/2023   LDLCALC 154 (H) 08/30/2023   TRIG 183 (H) 08/30/2023   CHOLHDL 4.5 08/30/2023   Lab Results  Component Value Date   TSH 2.462 08/30/2023   No results found for: HGBA1C Lab Results  Component Value Date   WBC 6.4 08/30/2023   HGB 11.9 (L) 08/30/2023   HCT 36.2 08/30/2023   MCV 89.2 08/30/2023   PLT 329 08/30/2023   Lab Results  Component Value Date   ALT 15 08/30/2023   AST 21 08/30/2023   ALKPHOS 74 08/30/2023   BILITOT 0.6 08/30/2023   No results found for: MARIEN BOLLS, VD25OH   Patient Active Problem List   Diagnosis Date Noted   Chronic pain of right knee 08/30/2023   Encounter for screening colonoscopy    Pharyngeal dysphagia 11/25/2021   GERD without esophagitis 11/25/2021   Status post hysterectomy 07/04/2018   History of closed head injury  06/21/2018   Hyperlipidemia LDL goal <70 06/10/2018   Cystocele, midline 05/24/2018   Cerebellar atrophy (HCC) 05/20/2018   Carotid atherosclerosis, bilateral 05/20/2018   Hx of completed stroke 05/20/2018   Family history of TIAs 05/11/2018   Essential hypertension 08/27/2009    Allergies  Allergen Reactions   Dilantin [Phenytoin Sodium Extended] Rash    Past Surgical History:  Procedure Laterality Date   ANTERIOR AND POSTERIOR REPAIR WITH SACROSPINOUS FIXATION N/A 05/26/2018   Procedure: ANTERIOR AND POSTERIOR REPAIR WITH SACROSPINOUS FIXATION;  Surgeon: Arloa Lamar SQUIBB, MD;  Location: [facility] ORS;  Service: Gynecology;  Laterality: N/A;   COLONOSCOPY WITH PROPOFOL  N/A 02/24/2022   Procedure: COLONOSCOPY WITH PROPOFOL ;  Surgeon: Unk Corinn Skiff, MD;  Location: [facility] ENDOSCOPY;  Service: Gastroenterology;  Laterality: N/A;   DILATION AND CURETTAGE OF UTERUS     after one of her deliveries    TONSILLECTOMY     TRACHEOSTOMY CLOSURE  1980   VAGINAL HYSTERECTOMY Bilateral 05/26/2018   Procedure: HYSTERECTOMY VAGINAL;  Surgeon: Arloa Lamar SQUIBB, MD;  Location: [facility] ORS;  Service: Gynecology;  Laterality: Bilateral;    Social History   Tobacco Use   Smoking status: Former    Current packs/day: 0.00    Types: Cigarettes    Start date: 05/13/1972    Quit date:  05/13/1976    Years since quitting: 47.6   Smokeless tobacco: Never  Vaping Use   Vaping status: Never Used  Substance Use Topics   Alcohol use: No   Drug use: Not Currently    Comment: prescribed     Medication list has been reviewed and updated.  Current Meds  Medication Sig   Fluocinolone Acetonide 0.01 % OIL SMARTSIG:2-4 Drop(s) In Ear(s) Twice a Week PRN   losartan  (COZAAR ) 100 MG tablet Take 1 tablet (100 mg total) by mouth daily.   Multiple Vitamins-Minerals (MULTIVITAMIN WITH MINERALS) tablet Take 1 tablet by mouth daily.       12/09/2023   10:45 AM 08/30/2023    8:26 AM 10/13/2022    8:36 AM 01/23/2022     2:48 PM  GAD 7 : Generalized Anxiety Score  Nervous, Anxious, on Edge 1 0 0 0  Control/stop worrying 1 0 0 0  Worry too much - different things 1 0 0 0  Trouble relaxing 0 0 0 0  Restless 0 0 0 0  Easily annoyed or irritable 0 0 0 1  Afraid - awful might happen 0 0 0 0  Total GAD 7 Score 3 0 0 1  Anxiety Difficulty Not difficult at all Not difficult at all Not difficult at all Not difficult at all       12/09/2023   10:45 AM 08/30/2023    8:25 AM 10/13/2022    8:36 AM  Depression screen PHQ 2/9  Decreased Interest 0 0 0  Down, Depressed, Hopeless 0 0 0  PHQ - 2 Score 0 0 0  Altered sleeping 0 0 0  Tired, decreased energy 2 1 1   Change in appetite 0 0 0  Feeling bad or failure about yourself  0 0 0  Trouble concentrating 0 0 0  Moving slowly or fidgety/restless 0 0 0  Suicidal thoughts 0 0 0  PHQ-9 Score 2 1 1   Difficult doing work/chores Not difficult at all Not difficult at all Not difficult at all    BP Readings from Last 3 Encounters:  12/09/23 122/70  12/02/23 138/79  08/30/23 126/77    Physical Exam Vitals and nursing note reviewed.  Constitutional:      General: She is not in acute distress.    Appearance: Normal appearance. She is well-developed.  HENT:     Head: Normocephalic and atraumatic.  Cardiovascular:     Rate and Rhythm: Normal rate and regular rhythm.  Pulmonary:     Effort: Pulmonary effort is normal. No respiratory distress.     Breath sounds: No wheezing or rhonchi.  Musculoskeletal:     Cervical back: Normal range of motion.  Lymphadenopathy:     Cervical: No cervical adenopathy.  Skin:    General: Skin is warm and dry.     Findings: No rash.  Neurological:     Mental Status: She is alert and oriented to person, place, and time.  Psychiatric:        Mood and Affect: Mood normal.        Behavior: Behavior normal.     Wt Readings from Last 3 Encounters:  12/09/23 155 lb (70.3 kg)  08/30/23 159 lb 6 oz (72.3 kg)  10/13/22 158 lb (71.7  kg)    BP 122/70   Pulse 75   Ht 5' 8 (1.727 m)   Wt 155 lb (70.3 kg)   SpO2 97%   BMI 23.57 kg/m   Assessment and Plan:  Problem List Items Addressed This Visit       Unprioritized   Essential hypertension (Chronic)   Blood pressure is well controlled.  Current medications losartan  Will continue same regimen along with efforts to limit dietary sodium.       Pharyngeal dysphagia - Primary (Chronic)   Worsening symptoms over the past few months. Can not see GI until October Will get Ba Swallow to rule out worrisome features Consider PPI daily  Recommend avoiding food triggers, eating slowly and chewing thoroughly      Relevant Orders   DG ESOPHAGUS W SINGLE CM (SOL OR THIN BA)    No follow-ups on file.    Leita HILARIO Adie, MD [facility] [place]

## 2023-12-09 NOTE — Assessment & Plan Note (Signed)
 Blood pressure is well controlled.  Current medications losartan Will continue same regimen along with efforts to limit dietary sodium.

## 2023-12-09 NOTE — Assessment & Plan Note (Signed)
 Worsening symptoms over the past few months. Can not see GI until October Will get Ba Swallow to rule out worrisome features Consider PPI daily  Recommend avoiding food triggers, eating slowly and chewing thoroughly

## 2023-12-14 ENCOUNTER — Ambulatory Visit
Admission: RE | Admit: 2023-12-14 | Discharge: 2023-12-14 | Disposition: A | Discharge status: Home / Self Care | Source: Ambulatory Visit | Attending: Internal Medicine | Admitting: Internal Medicine

## 2023-12-14 ENCOUNTER — Ambulatory Visit: Payer: Self-pay | Admitting: Internal Medicine

## 2023-12-14 ENCOUNTER — Other Ambulatory Visit: Payer: Self-pay | Admitting: Internal Medicine

## 2023-12-14 DIAGNOSIS — R1313 Dysphagia, pharyngeal phase: Secondary | ICD-10-CM | POA: Insufficient documentation

## 2023-12-14 DIAGNOSIS — R1310 Dysphagia, unspecified: Secondary | ICD-10-CM | POA: Diagnosis not present

## 2023-12-14 DIAGNOSIS — Z4682 Encounter for fitting and adjustment of non-vascular catheter: Secondary | ICD-10-CM | POA: Diagnosis not present

## 2023-12-14 DIAGNOSIS — K222 Esophageal obstruction: Secondary | ICD-10-CM

## 2023-12-14 DIAGNOSIS — K224 Dyskinesia of esophagus: Secondary | ICD-10-CM | POA: Diagnosis not present

## 2023-12-14 NOTE — Progress Notes (Unsigned)
 Date:  12/14/2023   Name:  Tonya Murray   DOB:  22-May-1956   MRN:  [id number]   Chief Complaint: No chief complaint on file.  HPI  Review of Systems   Lab Results  Component Value Date   NA 137 08/30/2023   K 4.2 08/30/2023   CO2 23 08/30/2023   GLUCOSE 89 08/30/2023   BUN 20 08/30/2023   CREATININE 0.85 08/30/2023   CALCIUM  9.2 08/30/2023   EGFR 80 10/13/2022   GFRNONAA >60 08/30/2023   Lab Results  Component Value Date   CHOL 246 (H) 08/30/2023   HDL 55 08/30/2023   LDLCALC 154 (H) 08/30/2023   TRIG 183 (H) 08/30/2023   CHOLHDL 4.5 08/30/2023   Lab Results  Component Value Date   TSH 2.462 08/30/2023   No results found for: HGBA1C Lab Results  Component Value Date   WBC 6.4 08/30/2023   HGB 11.9 (L) 08/30/2023   HCT 36.2 08/30/2023   MCV 89.2 08/30/2023   PLT 329 08/30/2023   Lab Results  Component Value Date   ALT 15 08/30/2023   AST 21 08/30/2023   ALKPHOS 74 08/30/2023   BILITOT 0.6 08/30/2023   No results found for: MARIEN BOLLS, VD25OH   Patient Active Problem List   Diagnosis Date Noted   Chronic pain of right knee 08/30/2023   Encounter for screening colonoscopy    Pharyngeal dysphagia 11/25/2021   GERD without esophagitis 11/25/2021   Status post hysterectomy 07/04/2018   History of closed head injury 06/21/2018   Hyperlipidemia LDL goal <70 06/10/2018   Cystocele, midline 05/24/2018   Cerebellar atrophy (HCC) 05/20/2018   Carotid atherosclerosis, bilateral 05/20/2018   Hx of completed stroke 05/20/2018   Family history of TIAs 05/11/2018   Essential hypertension 08/27/2009    Allergies  Allergen Reactions   Dilantin [Phenytoin Sodium Extended] Rash    Past Surgical History:  Procedure Laterality Date   ANTERIOR AND POSTERIOR REPAIR WITH SACROSPINOUS FIXATION N/A 05/26/2018   Procedure: ANTERIOR AND POSTERIOR REPAIR WITH SACROSPINOUS FIXATION;  Surgeon: Arloa Lamar SQUIBB, MD;  Location: [facility] ORS;   Service: Gynecology;  Laterality: N/A;   COLONOSCOPY WITH PROPOFOL  N/A 02/24/2022   Procedure: COLONOSCOPY WITH PROPOFOL ;  Surgeon: Unk Corinn Skiff, MD;  Location: [facility] ENDOSCOPY;  Service: Gastroenterology;  Laterality: N/A;   DILATION AND CURETTAGE OF UTERUS     after one of her deliveries    TONSILLECTOMY     TRACHEOSTOMY CLOSURE  1980   VAGINAL HYSTERECTOMY Bilateral 05/26/2018   Procedure: HYSTERECTOMY VAGINAL;  Surgeon: Arloa Lamar SQUIBB, MD;  Location: [facility] ORS;  Service: Gynecology;  Laterality: Bilateral;    Social History   Tobacco Use   Smoking status: Former    Current packs/day: 0.00    Types: Cigarettes    Start date: 05/13/1972    Quit date: 05/13/1976    Years since quitting: 47.6   Smokeless tobacco: Never  Vaping Use   Vaping status: Never Used  Substance Use Topics   Alcohol use: No   Drug use: Not Currently    Comment: prescribed     Medication list has been reviewed and updated.  No outpatient medications have been marked as taking for the 12/14/23 encounter (Orders Only) with Justus Leita DEL, MD.       12/09/2023   10:45 AM 08/30/2023    8:26 AM 10/13/2022    8:36 AM 01/23/2022    2:48 PM  GAD 7 : Generalized Anxiety  Score  Nervous, Anxious, on Edge 1 0 0 0  Control/stop worrying 1 0 0 0  Worry too much - different things 1 0 0 0  Trouble relaxing 0 0 0 0  Restless 0 0 0 0  Easily annoyed or irritable 0 0 0 1  Afraid - awful might happen 0 0 0 0  Total GAD 7 Score 3 0 0 1  Anxiety Difficulty Not difficult at all Not difficult at all Not difficult at all Not difficult at all       12/09/2023   10:45 AM 08/30/2023    8:25 AM 10/13/2022    8:36 AM  Depression screen PHQ 2/9  Decreased Interest 0 0 0  Down, Depressed, Hopeless 0 0 0  PHQ - 2 Score 0 0 0  Altered sleeping 0 0 0  Tired, decreased energy 2 1 1   Change in appetite 0 0 0  Feeling bad or failure about yourself  0 0 0  Trouble concentrating 0 0 0  Moving slowly or fidgety/restless 0  0 0  Suicidal thoughts 0 0 0  PHQ-9 Score 2 1 1   Difficult doing work/chores Not difficult at all Not difficult at all Not difficult at all    BP Readings from Last 3 Encounters:  12/09/23 122/70  12/02/23 138/79  08/30/23 126/77    Physical Exam  Wt Readings from Last 3 Encounters:  12/09/23 155 lb (70.3 kg)  08/30/23 159 lb 6 oz (72.3 kg)  10/13/22 158 lb (71.7 kg)    There were no vitals taken for this visit.  Assessment and Plan:  Problem List Items Addressed This Visit   None   No follow-ups on file.    Leita HILARIO Adie, MD [facility] [place]

## 2023-12-16 ENCOUNTER — Telehealth: Payer: Self-pay

## 2023-12-16 NOTE — Telephone Encounter (Signed)
 Patient called in about barium swallow test results. I informed patient of results and of her Urgent GI referral. She verbalized understanding and will await call from GI.  Tonya Murray

## 2023-12-16 NOTE — Telephone Encounter (Signed)
 JM called pt. Telephone call was noted.  KP  Copied from CRM [id number]. Topic: Clinical - Lab/Test Results >> Dec 16, 2023  9:32 AM Donna BRAVO wrote: Reason for CRM: patient returning call from , Caprice Mccaffrey  about swallow test results.

## 2024-01-05 ENCOUNTER — Ambulatory Visit

## 2024-01-12 ENCOUNTER — Ambulatory Visit: Admitting: Emergency Medicine

## 2024-01-12 VITALS — Ht 66.0 in | Wt 165.0 lb

## 2024-01-12 DIAGNOSIS — Z0000 Encounter for general adult medical examination without abnormal findings: Secondary | ICD-10-CM | POA: Diagnosis not present

## 2024-01-12 DIAGNOSIS — Z780 Asymptomatic menopausal state: Secondary | ICD-10-CM

## 2024-01-12 NOTE — Patient Instructions (Signed)
 [name] , Thank you for taking time out of your busy schedule to complete your Annual Wellness Visit with me. I enjoyed our conversation and look forward to speaking with you again next year. I, as well as your care team,  appreciate your ongoing commitment to your health goals. Please review the following plan we discussed and let me know if I can assist you in the future. Your Game plan/ To Do List    Referrals: None  Follow up Visits: We will see or speak with you next year for your Next Medicare AWV with our clinical staff Have you seen your provider in the last 6 months (3 months if uncontrolled diabetes)? Yes  Clinician Recommendations: Get a routine eye exam at your earliest convenience. I have included a list of eye doctors in the area.  Aim for 30 minutes of exercise or brisk walking, 6-8 glasses of water, and 5 servings of fruits and vegetables each day.   Please call to schedule your mammogram and bone density scan:  [facility] at [facility] Address: [address], [place], [place] Phone: [phone]  [facility] at [facility] [address], [address] [place], [place] [address] Phone: [phone]       This is a list of the screenings recommended for you:  Health Maintenance  Topic Date Due   Zoster (Shingles) Vaccine (1 of 2) Never done   DEXA scan (bone density measurement)  Never done   Mammogram  01/13/2023   COVID-19 Vaccine (1 - 2024-25 season) Never done   Flu Shot  01/06/2025*   Medicare Annual Wellness Visit  01/11/2025   DTaP/Tdap/Td vaccine (2 - Td or Tdap) 05/11/2028   Colon Cancer Screening  02/25/2032   Pneumococcal Vaccine for age over 77  Completed   Hepatitis C Screening  Completed   Hepatitis B Vaccine  Aged Out   HPV Vaccine  Aged Out   Meningitis B Vaccine  Aged Out  *Topic was postponed. The date shown is not the original due date.    Advanced directives: (Provided) Advance directive  discussed with you today. I have provided a copy for you to complete at home and have notarized. Once this is complete, please bring a copy in to our office so we can scan it into your chart.  Advance Care Planning is important because it:  [x]  Makes sure you receive the medical care that is consistent with your values, goals, and preferences  [x]  It provides guidance to your family and loved ones and reduces their decisional burden about whether or not they are making the right decisions based on your wishes.  Follow the link provided in your after visit summary or read over the paperwork we have mailed to you to help you started getting your Advance Directives in place. If you need assistance in completing these, please reach out to us  so that we can help you!  See attachments for Preventive Care and Fall Prevention Tips.   There are several Eye Doctors in your area. Here are a few that usually accept all insurance types:  [facility] [address] [place], [place] [address] Phone: [phone]  [facility] [address] [place], [place] [address] Phone: [phone]  [facility] [address] [place], [place] [address] Phone: [phone]  [facility] [address] [place], [place] [address] Phone: [phone]  [facility] [address] [place], [place] [address] Phone: [phone]  [facility] [address] [place], [place] [address] Phone: [phone]  Please let us  know if you require a referral for an eye exam appointment. Thank you!    Fall Prevention in the Home, Adult Falls can cause injuries and affect people of all ages. There are many simple things that you can do to make your home safe and to help prevent falls. If you need it, ask for help making these changes. What actions can I take to prevent falls? General information Use good lighting in all rooms. Make sure to: Replace any light bulbs that burn out. Turn on lights if it  is dark and use night-lights. Keep items that you use often in easy-to-reach places. Lower the shelves around your home if needed. Move furniture so that there are clear paths around it. Do not keep throw rugs or other things on the floor that can make you trip. If any of your floors are uneven, fix them. Add color or contrast paint or tape to clearly mark and help you see: Grab bars or handrails. First and last steps of staircases. Where the edge of each step is. If you use a ladder or stepladder: Make sure that it is fully opened. Do not climb a closed ladder. Make sure the sides of the ladder are locked in place. Have someone hold the ladder while you use it. Know where your pets are as you move through your home. What can I do in the bathroom?     Keep the floor dry. Clean up any water that is on the floor right away. Remove soap buildup in the bathtub or shower. Buildup makes bathtubs and showers slippery. Use non-skid mats or decals on the floor of the bathtub or shower. Attach bath mats securely with double-sided, non-slip rug tape. If you need to sit down while you are in the shower, use a non-slip stool. Install grab bars by the toilet and in the bathtub and shower. Do not use towel bars as grab bars. What can I do in the bedroom? Make sure that you have a light by your bed that is easy to reach. Do not use any sheets or blankets on your bed that hang to the floor. Have a firm bench or chair with side arms that you can use for support when you get dressed. What can I do in the kitchen? Clean up any spills right away. If you need to reach something above you, use a sturdy step stool that has a grab bar. Keep electrical cables out of the way. Do not use floor polish or wax that makes floors slippery. What can I do with my stairs? Do not leave anything on the stairs. Make sure that you have a light switch at the top and the bottom of the stairs. Have them installed if you do  not have them. Make sure that there are handrails on both sides of the stairs. Fix handrails that are broken or loose. Make sure that handrails are as long as the staircases. Install non-slip stair treads on all stairs in your home if they do not have carpet. Avoid having throw rugs at the top or bottom of stairs, or secure the rugs with carpet tape to prevent them from moving. Choose a carpet design that does not hide the edge of steps on the stairs. Make sure that carpet is firmly attached to the stairs. Fix any carpet that is loose or worn. What can I do on  the outside of my home? Use bright outdoor lighting. Repair the edges of walkways and driveways and fix any cracks. Clear paths of anything that can make you trip, such as tools or rocks. Add color or contrast paint or tape to clearly mark and help you see high doorway thresholds. Trim any bushes or trees on the main path into your home. Check that handrails are securely fastened and in good repair. Both sides of all steps should have handrails. Install guardrails along the edges of any raised decks or porches. Have leaves, snow, and ice cleared regularly. Use sand, salt, or ice melt on walkways during winter months if you live where there is ice and snow. In the garage, clean up any spills right away, including grease or oil spills. What other actions can I take? Review your medicines with your health care provider. Some medicines can make you confused or feel dizzy. This can increase your chance of falling. Wear closed-toe shoes that fit well and support your feet. Wear shoes that have rubber soles and low heels. Use a cane, walker, scooter, or crutches that help you move around if needed. Talk with your provider about other ways that you can decrease your risk of falls. This may include seeing a physical therapist to learn to do exercises to improve movement and strength. Where to find more information Centers for Disease Control and  Prevention, STEADI: TonerPromos.no [employer] on Aging: BaseRingTones.pl National Institute on Aging: BaseRingTones.pl Contact a health care provider if: You are afraid of falling at home. You feel weak, drowsy, or dizzy at home. You fall at home. Get help right away if you: Lose consciousness or have trouble moving after a fall. Have a fall that causes a head injury. These symptoms may be an emergency. Get help right away. Call 911. Do not wait to see if the symptoms will go away. Do not drive yourself to the hospital. This information is not intended to replace advice given to you by your health care provider. Make sure you discuss any questions you have with your health care provider. Document Revised: 01/26/2022 Document Reviewed: 01/26/2022 Elsevier Patient Education  2024 [employer].

## 2024-01-12 NOTE — Progress Notes (Signed)
 Subjective:   Tonya Murray is a 68 y.o. who presents for a Medicare Wellness preventive visit.  As a reminder, Annual Wellness Visits don't include a physical exam, and some assessments may be limited, especially if this visit is performed virtually. We may recommend an in-person follow-up visit with your provider if needed.  Visit Complete: Virtual I connected with  Joclynn Lumb on 01/12/24 by a audio enabled telemedicine application and verified that I am speaking with the correct person using two identifiers.  Patient Location: Home  Provider Location: Home Office  I discussed the limitations of evaluation and management by telemedicine. The patient expressed understanding and agreed to proceed.  Vital Signs: Because this visit was a virtual/telehealth visit, some criteria may be missing or patient reported. Any vitals not documented were not able to be obtained and vitals that have been documented are patient reported.  VideoDeclined- This patient declined Librarian, academic. Therefore the visit was completed with audio only.  Persons Participating in Visit: Patient.  AWV Questionnaire: No: Patient Medicare AWV questionnaire was not completed prior to this visit.  Cardiac Risk Factors include: advanced age (>7men, >32 women);dyslipidemia;hypertension     Objective:    Today's Vitals   01/12/24 1610  Weight: 165 lb (74.8 kg)  Height: 5' 6 (1.676 m)   Body mass index is 26.63 kg/m.     01/12/2024    4:26 PM 08/05/2022   10:39 AM 02/24/2022    9:10 AM 05/13/2018   11:02 AM  Advanced Directives  Does Patient Have a Medical Advance Directive? No No No No   Would patient like information on creating a medical advance directive? Yes (MAU/Ambulatory/Procedural Areas - Information given) No - Patient declined No - Patient declined      Data saved with a previous flowsheet row definition    Current Medications  (verified) Outpatient Encounter Medications as of 01/12/2024  Medication Sig   aspirin EC 325 MG tablet Take 650 mg by mouth daily.   losartan  (COZAAR ) 100 MG tablet Take 1 tablet (100 mg total) by mouth daily. (Patient taking differently: Take 100 mg by mouth daily. 1/2 tablet daily)   Multiple Vitamins-Minerals (MULTIVITAMIN WITH MINERALS) tablet Take 1 tablet by mouth daily.   Fluocinolone Acetonide 0.01 % OIL SMARTSIG:2-4 Drop(s) In Ear(s) Twice a Week PRN (Patient not taking: Reported on 01/12/2024)   No facility-administered encounter medications on file as of 01/12/2024.    Allergies (verified) Dilantin [phenytoin sodium extended] and Statins   History: 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   HTN (hypertension)    Hyperlipidemia    Normal cardiac stress test 2000s   normal with palps   Postmenopausal    Past Surgical History:  Procedure Laterality Date   ANTERIOR AND POSTERIOR REPAIR WITH SACROSPINOUS FIXATION N/A 05/26/2018   Procedure: ANTERIOR AND POSTERIOR REPAIR WITH SACROSPINOUS FIXATION;  Surgeon: Arloa Lamar SQUIBB, MD;  Location: [facility] ORS;  Service: Gynecology;  Laterality: N/A;   COLONOSCOPY WITH PROPOFOL  N/A 02/24/2022   Procedure: COLONOSCOPY WITH PROPOFOL ;  Surgeon: Unk Corinn Skiff, MD;  Location: [facility] ENDOSCOPY;  Service: Gastroenterology;  Laterality: N/A;   DILATION AND CURETTAGE OF UTERUS     after one of her deliveries    TONSILLECTOMY     TRACHEOSTOMY CLOSURE  1980   VAGINAL HYSTERECTOMY Bilateral 05/26/2018   Procedure: HYSTERECTOMY VAGINAL;  Surgeon: Arloa Lamar SQUIBB, MD;  Location: [facility] ORS;  Service: Gynecology;  Laterality: Bilateral;   Family History  Problem Relation Age of Onset   Hypertension Mother    Cancer Mother    Arrhythmia Mother    Diabetes Father    Cholelithiasis Father    Diabetes Sister    Cervical cancer Maternal Grandmother     Bladder Cancer Neg Hx    Kidney cancer Neg Hx    Breast cancer Neg Hx    Social History   Socioeconomic History   Marital status: Married    Spouse name: Lynwood   Number of children: 3   Years of education: 12   Highest education level: Bachelor's degree (e.g., BA, AB, BS)  Occupational History   Not on file  Tobacco Use   Smoking status: Former    Current packs/day: 0.00    Average packs/day: 0.5 packs/day for 4.0 years (2.0 ttl pk-yrs)    Types: Cigarettes    Start date: 05/13/1972    Quit date: 05/13/1976    Years since quitting: 47.6   Smokeless tobacco: Never  Vaping Use   Vaping status: Never Used  Substance and Sexual Activity   Alcohol use: No   Drug use: Not Currently    Comment: prescribed   Sexual activity: Not Currently  Other Topics Concern   Not on file  Social History Narrative   ** Merged History Encounter ** Full time. Regularly exercises.    01/12/24 still works part-time (20 hours per week) at [employer]   Social Drivers of [employer]: Low Risk  (01/12/2024)   Overall Financial Resource Strain (CARDIA)    Difficulty of Paying Living Expenses: Not hard at all  Food Insecurity: No Food Insecurity (01/12/2024)   Hunger Vital Sign    Worried About Running Out of Food in the Last Year: Never true    Ran Out of Food in the Last Year: Never true  Recent Concern: Food Insecurity - Food Insecurity Present (12/02/2023)   Hunger Vital Sign    Worried About Running Out of Food in the Last Year: Sometimes true    Ran Out of Food in the Last Year: Sometimes true  Transportation Needs: No Transportation Needs (01/12/2024)   PRAPARE - Administrator, Civil Service (Medical): No    Lack of Transportation (Non-Medical): No  Physical Activity: Insufficiently Active (01/12/2024)   Exercise Vital Sign    Days of Exercise per Week: 4 days    Minutes of Exercise per Session: 20 min  Stress: No Stress Concern Present (01/12/2024)   [employer] of Occupational Health - Occupational Stress Questionnaire    Feeling of Stress: Only a little  Social Connections: Socially Integrated (01/12/2024)   Social Connection and Isolation Panel    Frequency of Communication with Friends and Family: More than three times a week    Frequency of Social Gatherings with Friends and Family: More than three times a week    Attends Religious Services: More than 4 times per year    Active Member of [employer] or Organizations: Yes    Attends Engineer, structural: More than 4 times per year    Marital Status: Married    Tobacco Counseling Counseling given: Not Answered    Clinical Intake:  Pre-visit preparation completed: Yes  Pain : No/denies pain     BMI - recorded: 26.63 Nutritional Status: BMI 25 -29 Overweight Nutritional Risks: None Diabetes: No  No results found for: HGBA1C   How often do you need to have someone help you when you read instructions, pamphlets, or other written materials from your doctor or pharmacy?: 1 - Never  Interpreter Needed?: No  Information entered by :: Vina Ned, CMA   Activities of Daily Living     01/12/2024    4:12 PM  In your present state of health, do you have any difficulty performing the following activities:  Hearing? 0  Vision? 0  Difficulty concentrating or making decisions? 0  Walking or climbing stairs? 0  Dressing or bathing? 0  Doing errands, shopping? 0  Preparing Food and eating ? N  Using the Toilet? N  In the past six months, have you accidently leaked urine? N  Do you have problems with loss of bowel control? N  Managing your Medications? N  Managing your Finances? N  Housekeeping or managing your Housekeeping? N    Patient Care Team: Justus Leita DEL, MD as PCP - General (Internal Medicine) Unk Corinn Skiff, MD as Consulting Physician (Gastroenterology)  I have updated your Care Teams any recent Medical Services you may have received from other  providers in the past year.     Assessment:   This is a routine wellness examination for [facility].  Hearing/Vision screen Hearing Screening - Comments:: Denies hearing loss  Vision Screening - Comments:: Needs routine eye exam, included list of eye doctors in AVS   Goals Addressed               This Visit's Progress     Increase physical activity (pt-stated)         Depression Screen     01/12/2024    4:24 PM 12/09/2023   10:45 AM 08/30/2023    8:25 AM 10/13/2022    8:36 AM 08/05/2022   10:37 AM 01/23/2022    2:47 PM 11/25/2021    3:14 PM  PHQ 2/9 Scores  PHQ - 2 Score 0 0 0 0 0 1 1  PHQ- 9 Score 1 2 1 1  0 4 4    Fall Risk     01/12/2024    4:27 PM 12/09/2023   10:45 AM 08/30/2023    8:26 AM 10/13/2022    8:36 AM 08/05/2022   10:40 AM  Fall Risk   Falls in the past year? 0 0 0 0 0  Number falls in past yr: 0 0 0 0 0  Injury with Fall? 0 0 0 0 0  Risk for fall due to : No Fall Risks No Fall Risks No Fall Risks No Fall Risks No Fall Risks  Follow up Falls evaluation completed Falls evaluation completed Falls evaluation completed Falls evaluation completed Falls prevention discussed;Falls evaluation completed    MEDICARE RISK AT HOME:  Medicare Risk at Home Any stairs in or around the home?: Yes If so, are there any without handrails?: No Home free of loose throw rugs in walkways, pet beds, electrical cords, etc?: Yes Adequate lighting in your home to reduce risk of falls?: Yes Life alert?: No Use of a cane, walker or w/c?: No Grab bars in the bathroom?: Yes Shower chair or bench in shower?: Yes Elevated toilet seat or a handicapped toilet?: Yes  TIMED UP AND GO:  Was the test performed?  No  Cognitive Function: 6CIT completed        01/12/2024    4:28 PM 08/05/2022   10:47 AM  6CIT Screen  What Year? 0 points 0  points  What month? 0 points 0 points  What time? 0 points 0 points  Count back from 20 0 points 0 points  Months in reverse 0 points 0 points  Repeat  phrase 4 points 2 points  Total Score 4 points 2 points    Immunizations Immunization History  Administered Date(s) Administered   PNEUMOCOCCAL CONJUGATE-20 11/25/2021   Tdap 05/11/2018    Screening Tests Health Maintenance  Topic Date Due   Zoster Vaccines- Shingrix (1 of 2) Never done   DEXA SCAN  Never done   MAMMOGRAM  01/13/2023   COVID-19 Vaccine (1 - 2024-25 season) Never done   INFLUENZA VACCINE  01/06/2025 (Originally 01/07/2024)   Medicare Annual Wellness (AWV)  01/11/2025   DTaP/Tdap/Td (2 - Td or Tdap) 05/11/2028   Colonoscopy  02/25/2032   Pneumococcal Vaccine: 50+ Years  Completed   Hepatitis C Screening  Completed   Hepatitis B Vaccines  Aged Out   HPV VACCINES  Aged Out   Meningococcal B Vaccine  Aged Out    Health Maintenance  Health Maintenance Due  Topic Date Due   Zoster Vaccines- Shingrix (1 of 2) Never done   DEXA SCAN  Never done   MAMMOGRAM  01/13/2023   COVID-19 Vaccine (1 - 2024-25 season) Never done   Health Maintenance Items Addressed: DEXA ordered, See Nurse Notes at the end of this note  Additional Screening:  Vision Screening: Recommended annual ophthalmology exams for early detection of glaucoma and other disorders of the eye. Would you like a referral to an eye doctor? No    Dental Screening: Recommended annual dental exams for proper oral hygiene  Community Resource Referral / Chronic Care Management: CRR required this visit?  No   CCM required this visit?  No   Plan:    I have personally reviewed and noted the following in the patient's chart:   Medical and social history Use of alcohol, tobacco or illicit drugs  Current medications and supplements including opioid prescriptions. Patient is not currently taking opioid prescriptions. Functional ability and status Nutritional status Physical activity Advanced directives List of other physicians Hospitalizations, surgeries, and ER visits in previous 12  months Vitals Screenings to include cognitive, depression, and falls Referrals and appointments  In addition, I have reviewed and discussed with patient certain preventive protocols, quality metrics, and best practice recommendations. A written personalized care plan for preventive services as well as general preventive health recommendations were provided to patient.   Vina Ned, CMA   01/12/2024   After Visit Summary: (Mail) Due to this being a telephonic visit, the after visit summary with patients personalized plan was offered to patient via mail   Notes:  6 CIT Score - 4 Placed order for DEXA scan Reminded patient to schedule [facility] (order placed 08/30/23) Declined flu, covid and shingles vaccines Needs routine eye exam, included a list of local eye doctors in AVS

## 2024-02-15 ENCOUNTER — Encounter: Payer: Self-pay | Admitting: Internal Medicine

## 2024-02-15 ENCOUNTER — Ambulatory Visit (INDEPENDENT_AMBULATORY_CARE_PROVIDER_SITE_OTHER): Admitting: Internal Medicine

## 2024-02-15 VITALS — BP 126/78 | HR 70 | Ht 66.0 in | Wt 157.0 lb

## 2024-02-15 DIAGNOSIS — K224 Dyskinesia of esophagus: Secondary | ICD-10-CM | POA: Diagnosis not present

## 2024-02-15 DIAGNOSIS — I10 Essential (primary) hypertension: Secondary | ICD-10-CM

## 2024-02-15 DIAGNOSIS — K222 Esophageal obstruction: Secondary | ICD-10-CM

## 2024-02-15 DIAGNOSIS — E785 Hyperlipidemia, unspecified: Secondary | ICD-10-CM

## 2024-02-15 DIAGNOSIS — R1313 Dysphagia, pharyngeal phase: Secondary | ICD-10-CM

## 2024-02-15 NOTE — Assessment & Plan Note (Signed)
 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 Declines PPI due to perceived lack of benefit. Will refer to GI.

## 2024-02-15 NOTE — Assessment & Plan Note (Signed)
 Blood pressure is well controlled on losartan  50 mg. No medication side effects noted. Plan to continue current medications.

## 2024-02-15 NOTE — Assessment & Plan Note (Signed)
 Elevated 10 yr risk on last labs. However, she is not interested in medications at this time. Recommend considering a trial of statin in the near future.

## 2024-02-15 NOTE — Progress Notes (Signed)
 Date:  02/15/2024   Name:  Tonya Murray   DOB:  03/11/1956   MRN:  [id number]   Chief Complaint: Hypertension  Hypertension This is a chronic problem. The problem is controlled. Pertinent negatives include no chest pain, headaches, palpitations or shortness of breath. Past treatments include angiotensin blockers.  Hyperlipidemia This is a chronic problem. The problem is uncontrolled. Recent lipid tests were reviewed and are high. Pertinent negatives include no chest pain, myalgias or shortness of breath. She is currently on no antihyperlipidemic treatment.  Dysphagia - her Barium swallow shows dysmotility and GE junction stricture.  She continues to have issues.  We were unable to contact her because her phone number changed and she did not notify us .  She has lost a few lbs and needs to see GI.  Review of Systems  Constitutional:  Positive for unexpected weight change. Negative for fatigue.  HENT:  Positive for trouble swallowing.   Eyes:  Negative for visual disturbance.  Respiratory:  Negative for cough, chest tightness, shortness of breath and wheezing.   Cardiovascular:  Negative for chest pain, palpitations and leg swelling.  Gastrointestinal:  Negative for abdominal pain, constipation and diarrhea.  Musculoskeletal:  Negative for arthralgias and myalgias.  Neurological:  Negative for dizziness, weakness, light-headedness and headaches.  Psychiatric/Behavioral:  Negative for dysphoric mood. The patient is not nervous/anxious.      Lab Results  Component Value Date   NA 137 08/30/2023   K 4.2 08/30/2023   CO2 23 08/30/2023   GLUCOSE 89 08/30/2023   BUN 20 08/30/2023   CREATININE 0.85 08/30/2023   CALCIUM  9.2 08/30/2023   EGFR 80 10/13/2022   GFRNONAA >60 08/30/2023   Lab Results  Component Value Date   CHOL 246 (H) 08/30/2023   HDL 55 08/30/2023   LDLCALC 154 (H) 08/30/2023   TRIG 183 (H) 08/30/2023   CHOLHDL 4.5 08/30/2023   Lab Results  Component Value  Date   TSH 2.462 08/30/2023   No results found for: HGBA1C Lab Results  Component Value Date   WBC 6.4 08/30/2023   HGB 11.9 (L) 08/30/2023   HCT 36.2 08/30/2023   MCV 89.2 08/30/2023   PLT 329 08/30/2023   Lab Results  Component Value Date   ALT 15 08/30/2023   AST 21 08/30/2023   ALKPHOS 74 08/30/2023   BILITOT 0.6 08/30/2023   No results found for: MARIEN BOLLS, VD25OH   Patient Active Problem List   Diagnosis Date Noted   Chronic pain of right knee 08/30/2023   Encounter for screening colonoscopy    Pharyngeal dysphagia 11/25/2021   GERD without esophagitis 11/25/2021   Status post hysterectomy 07/04/2018   History of closed head injury 06/21/2018   Hyperlipidemia LDL goal <70 06/10/2018   Cystocele, midline 05/24/2018   Cerebellar atrophy (HCC) 05/20/2018   Carotid atherosclerosis, bilateral 05/20/2018   Hx of completed stroke 05/20/2018   Family history of TIAs 05/11/2018   Essential hypertension 08/27/2009    Allergies  Allergen Reactions   Dilantin [Phenytoin Sodium Extended] Rash   Statins Rash    Muscle aches    Past Surgical History:  Procedure Laterality Date   ANTERIOR AND POSTERIOR REPAIR WITH SACROSPINOUS FIXATION N/A 05/26/2018   Procedure: ANTERIOR AND POSTERIOR REPAIR WITH SACROSPINOUS FIXATION;  Surgeon: Arloa Lamar SQUIBB, MD;  Location: [facility] ORS;  Service: Gynecology;  Laterality: N/A;   COLONOSCOPY WITH PROPOFOL  N/A 02/24/2022   Procedure: COLONOSCOPY WITH PROPOFOL ;  Surgeon: Unk Corinn Skiff,  MD;  Location: [facility] ENDOSCOPY;  Service: Gastroenterology;  Laterality: N/A;   DILATION AND CURETTAGE OF UTERUS     after one of her deliveries    TONSILLECTOMY     TRACHEOSTOMY CLOSURE  1980   VAGINAL HYSTERECTOMY Bilateral 05/26/2018   Procedure: HYSTERECTOMY VAGINAL;  Surgeon: Arloa Lamar SQUIBB, MD;  Location: [facility] ORS;  Service: Gynecology;  Laterality: Bilateral;    Social History   Tobacco Use   Smoking status: Former     Current packs/day: 0.00    Average packs/day: 0.5 packs/day for 4.0 years (2.0 ttl pk-yrs)    Types: Cigarettes    Start date: 05/13/1972    Quit date: 05/13/1976    Years since quitting: 47.7   Smokeless tobacco: Never  Vaping Use   Vaping status: Never Used  Substance Use Topics   Alcohol use: No   Drug use: Not Currently    Comment: prescribed     Medication list has been reviewed and updated.  Current Meds  Medication Sig   aspirin EC 325 MG tablet Take 650 mg by mouth daily.   Fluocinolone Acetonide 0.01 % OIL SMARTSIG:2-4 Drop(s) In Ear(s) Twice a Week PRN   losartan  (COZAAR ) 100 MG tablet Take 1 tablet (100 mg total) by mouth daily. (Patient taking differently: Take 100 mg by mouth daily. 1/2 tablet daily)   Multiple Vitamins-Minerals (MULTIVITAMIN WITH MINERALS) tablet Take 1 tablet by mouth daily.       02/15/2024    9:35 AM 12/09/2023   10:45 AM 08/30/2023    8:26 AM 10/13/2022    8:36 AM  GAD 7 : Generalized Anxiety Score  Nervous, Anxious, on Edge 1 1 0 0  Control/stop worrying 1 1 0 0  Worry too much - different things 1 1 0 0  Trouble relaxing 0 0 0 0  Restless 0 0 0 0  Easily annoyed or irritable 0 0 0 0  Afraid - awful might happen 0 0 0 0  Total GAD 7 Score 3 3 0 0  Anxiety Difficulty Not difficult at all Not difficult at all Not difficult at all Not difficult at all       02/15/2024    9:34 AM 01/12/2024    4:24 PM 12/09/2023   10:45 AM  Depression screen PHQ 2/9  Decreased Interest 0 0 0  Down, Depressed, Hopeless 0 0 0  PHQ - 2 Score 0 0 0  Altered sleeping 0 0 0  Tired, decreased energy 1 1 2   Change in appetite 0 0 0  Feeling bad or failure about yourself  0 0 0  Trouble concentrating 0 0 0  Moving slowly or fidgety/restless 0 0 0  Suicidal thoughts 0 0 0  PHQ-9 Score 1 1 2   Difficult doing work/chores Not difficult at all Not difficult at all Not difficult at all    BP Readings from Last 3 Encounters:  02/15/24 126/78  12/09/23 122/70   12/02/23 138/79    Physical Exam Vitals and nursing note reviewed.  Constitutional:      General: She is not in acute distress.    Appearance: Normal appearance. She is well-developed.  HENT:     Head: Normocephalic and atraumatic.  Cardiovascular:     Rate and Rhythm: Normal rate and regular rhythm.  Pulmonary:     Effort: Pulmonary effort is normal. No respiratory distress.     Breath sounds: No wheezing or rhonchi.  Musculoskeletal:     Right lower leg: No  edema.     Left lower leg: No edema.  Skin:    General: Skin is warm and dry.     Findings: No rash.  Neurological:     General: No focal deficit present.     Mental Status: She is alert and oriented to person, place, and time.  Psychiatric:        Mood and Affect: Mood normal.        Behavior: Behavior normal.     Wt Readings from Last 3 Encounters:  02/15/24 157 lb (71.2 kg)  01/12/24 165 lb (74.8 kg)  12/09/23 155 lb (70.3 kg)    BP 126/78   Pulse 70   Ht 5' 6 (1.676 m)   Wt 157 lb (71.2 kg)   SpO2 99%   BMI 25.34 kg/m   Assessment and Plan:  Problem List Items Addressed This Visit       Unprioritized   Essential hypertension - Primary (Chronic)   Blood pressure is well controlled on losartan  50 mg. No medication side effects noted. Plan to continue current medications.       Hyperlipidemia LDL goal <70 (Chronic)   Elevated 10 yr risk on last labs. However, she is not interested in medications at this time. Recommend considering a trial of statin in the near future.       Pharyngeal dysphagia (Chronic)   No evidence of upper airway narrowing or ulcer but she does have dysmotility and GEJ stricture. Declines PPI due to perceived lack of benefit. Will refer to GI.      Other Visit Diagnoses       Esophageal dysmotility       Relevant Orders   Ambulatory referral to Gastroenterology     Esophageal stricture       Relevant Orders   Ambulatory referral to Gastroenterology        No follow-ups on file.    Leita HILARIO Adie, MD [facility] [place]

## 2024-03-14 DIAGNOSIS — R1319 Other dysphagia: Secondary | ICD-10-CM | POA: Diagnosis not present

## 2024-03-16 ENCOUNTER — Encounter: Payer: Self-pay | Admitting: Gastroenterology

## 2024-03-16 NOTE — Anesthesia Preprocedure Evaluation (Addendum)
 Anesthesia Evaluation  Patient identified by MRN, date of birth, ID band Patient awake    Reviewed: Allergy & Precautions, H&P , NPO status , Patient's Chart, lab work & pertinent test results  Airway Mallampati: III  TM Distance: >3 FB Neck ROM: Full    Dental no notable dental hx. (+) Missing, Partial Upper Recently removed lower teeth, patient discussed w/Dr. Unk her concerns about this area. Poor Dentition, Partial Upper, Missing   :   Pulmonary neg pulmonary ROS, former smoker   Pulmonary exam normal breath sounds clear to auscultation       Cardiovascular hypertension, negative cardio ROS Normal cardiovascular exam Rhythm:Regular Rate:Normal     Neuro/Psych Seizures -,  negative neurological ROS  negative psych ROS   GI/Hepatic negative GI ROS, Neg liver ROS,GERD  ,,  Endo/Other  negative endocrine ROS    Renal/GU negative Renal ROS  negative genitourinary   Musculoskeletal negative musculoskeletal ROS (+)    Abdominal   Peds negative pediatric ROS (+)  Hematology negative hematology ROS (+)   Anesthesia Other Findings HTN (hypertension)  Chest pain Normal cardiac stress test  Postmenopausal Bladder prolapse, female, acquired  History of closed head injury Hyperlipidemia  GERD (gastroesophageal reflux disease) Seizure (HCC)     Reproductive/Obstetrics negative OB ROS                              Anesthesia Physical Anesthesia Plan  ASA: 3  Anesthesia Plan: General   Post-op Pain Management:    Induction: Intravenous  PONV Risk Score and Plan:   Airway Management Planned: Natural Airway and Nasal Cannula  Additional Equipment:   Intra-op Plan:   Post-operative Plan:   Informed Consent: I have reviewed the patients History and Physical, chart, labs and discussed the procedure including the risks, benefits and alternatives for the proposed anesthesia with the  patient or authorized representative who has indicated his/her understanding and acceptance.     Dental Advisory Given  Plan Discussed with: Anesthesiologist, CRNA and Surgeon  Anesthesia Plan Comments: (Patient consented for risks of anesthesia including but not limited to:  - adverse reactions to medications - risk of airway placement if required - damage to eyes, teeth, lips or other oral mucosa - nerve damage due to positioning  - sore throat or hoarseness - Damage to heart, brain, nerves, lungs, other parts of body or loss of life  Patient voiced understanding and assent.)         Anesthesia Quick Evaluation

## 2024-03-21 ENCOUNTER — Encounter
Admission: RE | Disposition: A | Discharge status: Home / Self Care | Payer: Self-pay | Source: Home / Self Care | Attending: Gastroenterology

## 2024-03-21 ENCOUNTER — Ambulatory Visit: Payer: Self-pay | Admitting: Anesthesiology

## 2024-03-21 ENCOUNTER — Ambulatory Visit
Admission: RE | Admit: 2024-03-21 | Discharge: 2024-03-21 | Disposition: A | Discharge status: Home / Self Care | Attending: Gastroenterology | Admitting: Gastroenterology

## 2024-03-21 ENCOUNTER — Encounter: Payer: Self-pay | Admitting: Gastroenterology

## 2024-03-21 DIAGNOSIS — Z79899 Other long term (current) drug therapy: Secondary | ICD-10-CM | POA: Diagnosis not present

## 2024-03-21 DIAGNOSIS — K449 Diaphragmatic hernia without obstruction or gangrene: Secondary | ICD-10-CM | POA: Diagnosis not present

## 2024-03-21 DIAGNOSIS — R569 Unspecified convulsions: Secondary | ICD-10-CM | POA: Diagnosis not present

## 2024-03-21 DIAGNOSIS — K222 Esophageal obstruction: Secondary | ICD-10-CM | POA: Insufficient documentation

## 2024-03-21 DIAGNOSIS — I10 Essential (primary) hypertension: Secondary | ICD-10-CM | POA: Insufficient documentation

## 2024-03-21 DIAGNOSIS — R1319 Other dysphagia: Secondary | ICD-10-CM | POA: Diagnosis present

## 2024-03-21 DIAGNOSIS — K219 Gastro-esophageal reflux disease without esophagitis: Secondary | ICD-10-CM | POA: Diagnosis not present

## 2024-03-21 DIAGNOSIS — R1314 Dysphagia, pharyngoesophageal phase: Secondary | ICD-10-CM | POA: Diagnosis not present

## 2024-03-21 DIAGNOSIS — Z87891 Personal history of nicotine dependence: Secondary | ICD-10-CM | POA: Insufficient documentation

## 2024-03-21 DIAGNOSIS — Z7982 Long term (current) use of aspirin: Secondary | ICD-10-CM | POA: Insufficient documentation

## 2024-03-21 DIAGNOSIS — E785 Hyperlipidemia, unspecified: Secondary | ICD-10-CM | POA: Diagnosis not present

## 2024-03-21 HISTORY — DX: Unspecified convulsions: R56.9

## 2024-03-21 HISTORY — DX: Gastro-esophageal reflux disease without esophagitis: K21.9

## 2024-03-21 SURGERY — EGD (ESOPHAGOGASTRODUODENOSCOPY)
Anesthesia: General | Site: Esophagus

## 2024-03-21 MED ORDER — ASPIRIN 81 MG PO TBEC: 81.0000 mg | DELAYED_RELEASE_TABLET | Freq: Every day | ORAL | 12 refills | Status: AC

## 2024-03-21 MED ORDER — OMEPRAZOLE 40 MG PO CPDR: 40.0000 mg | DELAYED_RELEASE_CAPSULE | Freq: Two times a day (BID) | ORAL | 5 refills | Status: AC

## 2024-03-21 MED ORDER — LACTATED RINGERS IV SOLN: INTRAVENOUS | Status: DC

## 2024-03-21 MED ORDER — STERILE WATER FOR IRRIGATION IR SOLN
Status: DC | PRN
  Administered 2024-03-21: 1

## 2024-03-21 MED ORDER — PROPOFOL 10 MG/ML IV BOLUS
INTRAVENOUS | Status: DC | PRN
  Administered 2024-03-21: 50 mg via INTRAVENOUS
  Administered 2024-03-21 (×2): 20 mg via INTRAVENOUS
  Administered 2024-03-21: 50 mg via INTRAVENOUS

## 2024-03-21 MED ORDER — SODIUM CHLORIDE 0.9 % IV SOLN
INTRAVENOUS | Status: DC | PRN
Start: 2024-03-21 — End: 2024-03-21

## 2024-03-21 MED ORDER — LIDOCAINE HCL (CARDIAC) PF 100 MG/5ML IV SOSY
PREFILLED_SYRINGE | INTRAVENOUS | Status: DC | PRN
  Administered 2024-03-21: 40 mg via INTRATRACHEAL

## 2024-03-21 SURGICAL SUPPLY — 8 items
BALLOON DILATOR 12-15 8 (BALLOONS) IMPLANT
BALLOON DILATOR CRE 0-12 8 (BALLOONS) IMPLANT
BLOCK BITE 60FR ADLT L/F GRN (MISCELLANEOUS) ×3 IMPLANT
GOWN CVR UNV OPN BCK APRN NK (MISCELLANEOUS) ×6 IMPLANT
KIT PRC NS LF DISP ENDO (KITS) ×3 IMPLANT
MANIFOLD NEPTUNE II (INSTRUMENTS) ×3 IMPLANT
SYR INFLATION 60ML (SYRINGE) IMPLANT
WATER STERILE IRR 250ML POUR (IV SOLUTION) ×3 IMPLANT

## 2024-03-21 NOTE — Transfer of Care (Signed)
 Immediate Anesthesia Transfer of Care Note  Patient: Tonya Murray [facility]  Procedure(s) Performed: EGD (ESOPHAGOGASTRODUODENOSCOPY) DILATION, ESOPHAGUS (Esophagus)  Patient Location: PACU  Anesthesia Type: General  Level of Consciousness: awake, alert  and patient cooperative  Airway and Oxygen Therapy: Patient Spontanous Breathing and Patient connected to supplemental oxygen  Post-op Assessment: Post-op Vital signs reviewed, Patient's Cardiovascular Status Stable, Respiratory Function Stable, Patent Airway and No signs of Nausea or vomiting  Post-op Vital Signs: Reviewed and stable  Complications: No notable events documented.

## 2024-03-21 NOTE — Anesthesia Postprocedure Evaluation (Signed)
 Anesthesia Post Note  Patient: Ginny Loomer [facility]  Procedure(s) Performed: EGD (ESOPHAGOGASTRODUODENOSCOPY) DILATION, ESOPHAGUS (Esophagus)  Patient location during evaluation: PACU Anesthesia Type: General Level of consciousness: awake and alert Pain management: pain level controlled Vital Signs Assessment: post-procedure vital signs reviewed and stable Respiratory status: spontaneous breathing, nonlabored ventilation, respiratory function stable and patient connected to nasal cannula oxygen Cardiovascular status: blood pressure returned to baseline and stable Postop Assessment: no apparent nausea or vomiting Anesthetic complications: no   No notable events documented.   Last Vitals:  Vitals:   03/21/24 0952 03/21/24 0957  BP:    Pulse: 65 60  Resp: 15 14  Temp:    SpO2: 97% 97%    Last Pain:  Vitals:   03/21/24 0952  TempSrc:   PainSc: 0-No pain                 Donny JAYSON Mu
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
## 2024-03-21 NOTE — Op Note (Signed)
 [facility] Patient Name: Tonya Murray Procedure Date: 03/21/2024 9:17 AM MRN: [id number] Account #: [id number] Date of Birth: [birth year] Admit Type: Outpatient Age: 68 Room: [facility] OR ROOM 01 Gender: Female Note Status: Finalized Instrument Name: Endoscope [id number] Procedure:             Upper GI endoscopy Indications:           Esophageal dysphagia Providers:             Corinn Jess Brooklyn MD, MD Referring MD:          Leita Adie, MD (Referring MD) Medicines:             General Anesthesia Complications:         No immediate complications. Estimated blood loss:                         Minimal. Procedure:             Pre-Anesthesia Assessment:                        - Prior to the procedure, a History and Physical was                         performed, and patient medications and allergies were                         reviewed. The patient is competent. The risks and                         benefits of the procedure and the sedation options and                         risks were discussed with the patient. All questions                         were answered and informed consent was obtained.                         Patient identification and proposed procedure were                         verified by the physician, the nurse, the                         anesthesiologist, the anesthetist and the technician                         in the pre-procedure area in the procedure room in the                         endoscopy suite. Mental Status Examination: alert and                         oriented. Airway Examination: normal oropharyngeal                         airway and neck mobility. Respiratory Examination:  clear to auscultation. CV Examination: normal.                         Prophylactic Antibiotics: The patient does not require                         prophylactic antibiotics. Prior Anticoagulants: The                          patient has taken no anticoagulant or antiplatelet                         agents. ASA Grade Assessment: III - A patient with                         severe systemic disease. After reviewing the risks and                         benefits, the patient was deemed in satisfactory                         condition to undergo the procedure. The anesthesia                         plan was to use general anesthesia. Immediately prior                         to administration of medications, the patient was                         re-assessed for adequacy to receive sedatives. The                         heart rate, respiratory rate, oxygen saturations,                         blood pressure, adequacy of pulmonary ventilation, and                         response to care were monitored throughout the                         procedure. The physical status of the patient was                         re-assessed after the procedure.                        After obtaining informed consent, the endoscope was                         passed under direct vision. Throughout the procedure,                         the patient's blood pressure, pulse, and oxygen                         saturations were monitored continuously. The Endoscope  was introduced through the mouth, and advanced to the                         second part of duodenum. The upper GI endoscopy was                         accomplished without difficulty. The patient tolerated                         the procedure well. Findings:      The duodenal bulb and second portion of the duodenum were normal.      A medium-sized hiatal hernia was present.      The entire examined stomach was normal.      The cardia and gastric fundus were normal on retroflexion.      Esophagogastric landmarks were identified: the gastroesophageal junction       was found at 34 cm from the incisors.      One benign-appearing,  intrinsic moderate (circumferential scarring or       stenosis; an endoscope may pass) stenosis was found 34 cm from the       incisors. The stenosis was traversed. A TTS dilator was passed through       the scope. Dilation with a 03-19-11 mm balloon and a 12-13.5-15 mm       balloon dilator was performed to 13.5 mm. The dilation site was examined       following endoscope reinsertion and showed moderate mucosal disruption       and moderate improvement in luminal narrowing. Estimated blood loss was       minimal. Impression:            - Normal duodenal bulb and second portion of the                         duodenum.                        - Medium-sized hiatal hernia.                        - Normal stomach.                        - Esophagogastric landmarks identified.                        - Benign-appearing esophageal stenosis. Dilated.                        - No specimens collected. Recommendation:        - Discharge patient to home (with escort).                        - Resume previous diet today.                        - Continue present medications.                        - Use a proton pump inhibitor PO BID indefinitely.                        -  Repeat upper endoscopy in 1 month for retreatment. Procedure Code(s):     --- Professional ---                        [phone], Esophagogastroduodenoscopy, flexible,                         transoral; with transendoscopic balloon dilation of                         esophagus (less than 30 mm diameter) Diagnosis Code(s):     --- Professional ---                        K44.9, Diaphragmatic hernia without obstruction or                         gangrene                        K22.2, Esophageal obstruction                        R13.14, Dysphagia, pharyngoesophageal phase CPT copyright 2022 American Medical Association. All rights reserved. The codes documented in this report are preliminary and upon coder review may  be revised to meet  current compliance requirements. Dr. Corinn Brooklyn Corinn Jess Brooklyn MD, MD 03/21/2024 9:40:07 AM This report has been signed electronically. Number of Addenda: 0 Note Initiated On: 03/21/2024 9:17 AM Total Procedure Duration: 0 hours 7 minutes 48 seconds  Estimated Blood Loss:  Estimated blood loss was minimal.      [facility]

## 2024-03-22 ENCOUNTER — Encounter: Payer: Self-pay | Admitting: Gastroenterology

## 2024-04-03 ENCOUNTER — Telehealth: Payer: Self-pay | Admitting: Pharmacist

## 2024-04-03 ENCOUNTER — Other Ambulatory Visit: Payer: Self-pay | Admitting: Internal Medicine

## 2024-04-03 DIAGNOSIS — I10 Essential (primary) hypertension: Secondary | ICD-10-CM

## 2024-04-03 MED ORDER — LOSARTAN POTASSIUM 50 MG PO TABS: 50.0000 mg | ORAL_TABLET | Freq: Every day | ORAL | 0 refills | Status: DC

## 2024-04-03 NOTE — Progress Notes (Unsigned)
 Date:  04/03/2024   Name:  Latangela Mccomas   DOB:  09-28-1955   MRN:  [id number]   Chief Complaint: No chief complaint on file.  HPI  Review of Systems   Lab Results  Component Value Date   NA 137 08/30/2023   K 4.2 08/30/2023   CO2 23 08/30/2023   GLUCOSE 89 08/30/2023   BUN 20 08/30/2023   CREATININE 0.85 08/30/2023   CALCIUM  9.2 08/30/2023   EGFR 80 10/13/2022   GFRNONAA >60 08/30/2023   Lab Results  Component Value Date   CHOL 246 (H) 08/30/2023   HDL 55 08/30/2023   LDLCALC 154 (H) 08/30/2023   TRIG 183 (H) 08/30/2023   CHOLHDL 4.5 08/30/2023   Lab Results  Component Value Date   TSH 2.462 08/30/2023   No results found for: HGBA1C Lab Results  Component Value Date   WBC 6.4 08/30/2023   HGB 11.9 (L) 08/30/2023   HCT 36.2 08/30/2023   MCV 89.2 08/30/2023   PLT 329 08/30/2023   Lab Results  Component Value Date   ALT 15 08/30/2023   AST 21 08/30/2023   ALKPHOS 74 08/30/2023   BILITOT 0.6 08/30/2023   No results found for: MARIEN BOLLS, VD25OH   Patient Active Problem List   Diagnosis Date Noted   Chronic pain of right knee 08/30/2023   Encounter for screening colonoscopy    Pharyngeal dysphagia 11/25/2021   GERD without esophagitis 11/25/2021   Status post hysterectomy 07/04/2018   History of closed head injury 06/21/2018   Hyperlipidemia LDL goal <70 06/10/2018   Cystocele, midline 05/24/2018   Cerebellar atrophy 05/20/2018   Carotid atherosclerosis, bilateral 05/20/2018   Hx of completed stroke 05/20/2018   Family history of TIAs 05/11/2018   Essential hypertension 08/27/2009    Allergies  Allergen Reactions   Dilantin [Phenytoin Sodium Extended] Rash   Statins Rash    Muscle aches    Past Surgical History:  Procedure Laterality Date   ANTERIOR AND POSTERIOR REPAIR WITH SACROSPINOUS FIXATION N/A 05/26/2018   Procedure: ANTERIOR AND POSTERIOR REPAIR WITH SACROSPINOUS FIXATION;  Surgeon: Arloa Lamar SQUIBB, MD;   Location: [facility] ORS;  Service: Gynecology;  Laterality: N/A;   COLONOSCOPY WITH PROPOFOL  N/A 02/24/2022   Procedure: COLONOSCOPY WITH PROPOFOL ;  Surgeon: Unk Corinn Skiff, MD;  Location: [facility] ENDOSCOPY;  Service: Gastroenterology;  Laterality: N/A;   DILATION AND CURETTAGE OF UTERUS     after one of her deliveries    ESOPHAGEAL DILATION N/A 03/21/2024   Procedure: DILATION, ESOPHAGUS;  Surgeon: Unk Corinn Skiff, MD;  Location: [facility];  Service: Endoscopy;  Laterality: N/A;  10-13.74mm   ESOPHAGOGASTRODUODENOSCOPY N/A 03/21/2024   Procedure: EGD (ESOPHAGOGASTRODUODENOSCOPY);  Surgeon: Unk Corinn Skiff, MD;  Location: [facility];  Service: Endoscopy;  Laterality: N/A;   TONSILLECTOMY     TRACHEOSTOMY CLOSURE  1980   VAGINAL HYSTERECTOMY Bilateral 05/26/2018   Procedure: HYSTERECTOMY VAGINAL;  Surgeon: Arloa Lamar SQUIBB, MD;  Location: [facility] ORS;  Service: Gynecology;  Laterality: Bilateral;    Social History   Tobacco Use   Smoking status: Former    Current packs/day: 0.00    Average packs/day: 0.5 packs/day for 4.0 years (2.0 ttl pk-yrs)    Types: Cigarettes    Start date: 05/13/1972    Quit date: 05/13/1976    Years since quitting: 47.9   Smokeless tobacco: Never  Vaping Use   Vaping status: Never Used  Substance Use Topics   Alcohol use:  No   Drug use: Not Currently    Comment: prescribed     Medication list has been reviewed and updated.  No outpatient medications have been marked as taking for the 04/03/24 encounter (Orders Only) with Justus Leita DEL, MD.       02/15/2024    9:35 AM 12/09/2023   10:45 AM 08/30/2023    8:26 AM 10/13/2022    8:36 AM  GAD 7 : Generalized Anxiety Score  Nervous, Anxious, on Edge 1 1 0 0  Control/stop worrying 1 1 0 0  Worry too much - different things 1 1 0 0  Trouble relaxing 0 0 0 0  Restless 0 0 0 0  Easily annoyed or irritable 0 0 0 0  Afraid - awful might happen 0 0 0 0  Total GAD 7 Score 3 3 0 0  Anxiety  Difficulty Not difficult at all Not difficult at all Not difficult at all Not difficult at all       02/15/2024    9:34 AM 01/12/2024    4:24 PM 12/09/2023   10:45 AM  Depression screen PHQ 2/9  Decreased Interest 0 0 0  Down, Depressed, Hopeless 0 0 0  PHQ - 2 Score 0 0 0  Altered sleeping 0 0 0  Tired, decreased energy 1 1 2   Change in appetite 0 0 0  Feeling bad or failure about yourself  0 0 0  Trouble concentrating 0 0 0  Moving slowly or fidgety/restless 0 0 0  Suicidal thoughts 0 0 0  PHQ-9 Score 1 1 2   Difficult doing work/chores Not difficult at all Not difficult at all Not difficult at all    BP Readings from Last 3 Encounters:  03/21/24 133/79  02/15/24 126/78  12/09/23 122/70    Physical Exam  Wt Readings from Last 3 Encounters:  03/21/24 156 lb (70.8 kg)  02/15/24 157 lb (71.2 kg)  01/12/24 165 lb (74.8 kg)    There were no vitals taken for this visit.  Assessment and Plan:  Problem List Items Addressed This Visit   None   No follow-ups on file.    Leita HILARIO Justus, MD [facility] [place]

## 2024-04-03 NOTE — Progress Notes (Signed)
 This patient is appearing on a report for being at risk of failing the adherence measure for hypertension (ACEi/ARB) medications this calendar year.   Medication: losartan  100 mg Last fill date: 11/30/2023 for 90 day supply  From review of chart, note at latest Office Visit with PCP on 02/15/2024, provider noted Blood pressure is well controlled on losartan  50 mg.  Left voicemail for patient to return my call at their convenience.  Will send message to collaborate with PCP to request new prescription be sent to pharmacy to reflect latest dosing, particularly as latest prescription is out of refills  Sharyle Sia, PharmD, [facility] [phone]

## 2024-04-27 ENCOUNTER — Encounter: Admission: RE | Disposition: A | Payer: Self-pay | Source: Home / Self Care | Attending: Gastroenterology

## 2024-04-27 ENCOUNTER — Encounter: Payer: Self-pay | Admitting: Gastroenterology

## 2024-04-27 ENCOUNTER — Ambulatory Visit

## 2024-04-27 ENCOUNTER — Encounter

## 2024-04-27 ENCOUNTER — Other Ambulatory Visit: Payer: Self-pay

## 2024-04-27 ENCOUNTER — Ambulatory Visit
Admission: RE | Admit: 2024-04-27 | Discharge: 2024-04-27 | Disposition: A | Discharge status: Home / Self Care | Attending: Gastroenterology | Admitting: Gastroenterology

## 2024-04-27 DIAGNOSIS — K3189 Other diseases of stomach and duodenum: Secondary | ICD-10-CM | POA: Diagnosis not present

## 2024-04-27 DIAGNOSIS — K219 Gastro-esophageal reflux disease without esophagitis: Secondary | ICD-10-CM | POA: Diagnosis not present

## 2024-04-27 DIAGNOSIS — Z87891 Personal history of nicotine dependence: Secondary | ICD-10-CM | POA: Diagnosis not present

## 2024-04-27 DIAGNOSIS — K222 Esophageal obstruction: Secondary | ICD-10-CM | POA: Diagnosis not present

## 2024-04-27 DIAGNOSIS — I10 Essential (primary) hypertension: Secondary | ICD-10-CM | POA: Diagnosis not present

## 2024-04-27 DIAGNOSIS — K449 Diaphragmatic hernia without obstruction or gangrene: Secondary | ICD-10-CM | POA: Diagnosis not present

## 2024-04-27 DIAGNOSIS — Z79899 Other long term (current) drug therapy: Secondary | ICD-10-CM | POA: Insufficient documentation

## 2024-04-27 DIAGNOSIS — R1314 Dysphagia, pharyngoesophageal phase: Secondary | ICD-10-CM | POA: Insufficient documentation

## 2024-04-27 SURGERY — EGD (ESOPHAGOGASTRODUODENOSCOPY)
Anesthesia: General

## 2024-04-27 MED ORDER — SODIUM CHLORIDE 0.9 % IV SOLN: INTRAVENOUS | Status: DC

## 2024-04-27 MED ORDER — PROPOFOL 1000 MG/100ML IV EMUL
INTRAVENOUS | Status: AC
  Filled 2024-04-27: qty 200

## 2024-04-27 MED ORDER — LIDOCAINE HCL (CARDIAC) PF 100 MG/5ML IV SOSY
PREFILLED_SYRINGE | INTRAVENOUS | Status: DC | PRN
  Administered 2024-04-27: 60 mg via INTRAVENOUS

## 2024-04-27 MED ORDER — PROPOFOL 10 MG/ML IV BOLUS
INTRAVENOUS | Status: DC | PRN
  Administered 2024-04-27: 70 mg via INTRAVENOUS

## 2024-04-27 MED ORDER — PROPOFOL 500 MG/50ML IV EMUL
INTRAVENOUS | Status: DC | PRN
Start: 2024-04-27 — End: 2024-04-27
  Administered 2024-04-27: 120 ug/kg/min via INTRAVENOUS
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
## 2024-04-27 NOTE — Anesthesia Postprocedure Evaluation (Signed)
 Anesthesia Post Note  Patient: Madelina Sanda [facility]  Procedure(s) Performed: EGD (ESOPHAGOGASTRODUODENOSCOPY) Balloon dilation wire-guided  Patient location during evaluation: Endoscopy Anesthesia Type: General Level of consciousness: awake and alert Pain management: pain level controlled Vital Signs Assessment: post-procedure vital signs reviewed and stable Respiratory status: spontaneous breathing, nonlabored ventilation and respiratory function stable Cardiovascular status: blood pressure returned to baseline and stable Postop Assessment: no apparent nausea or vomiting Anesthetic complications: no   No notable events documented.   Last Vitals:  Vitals:   04/27/24 0828 04/27/24 0839  BP: 111/63 130/69  Pulse:  63  Resp: 16 16  Temp:    SpO2:  97%    Last Pain:  Vitals:   04/27/24 0818  TempSrc:   PainSc: 0-No pain                 Fairy POUR Allen Basista
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
## 2024-04-27 NOTE — Op Note (Signed)
 [facility] Gastroenterology Patient Name: Tonya Murray Procedure Date: 04/27/2024 7:49 AM MRN: [id number] Account #: [id number] Date of Birth: 1955-08-10 Admit Type: Outpatient Age: 68 Room: [facility] ENDO ROOM 3 Gender: Female Note Status: Finalized Instrument Name: Upper GI Scope [id number] Procedure:             Upper GI endoscopy Indications:           Esophageal dysphagia, Stricture of the esophagus,                         Follow-up of esophageal stricture, For therapy of                         esophageal stricture Providers:             Corinn Jess Brooklyn MD, MD Referring MD:          Leita Adie, MD (Referring MD) Medicines:             General Anesthesia Complications:         No immediate complications. Estimated blood loss: None. Procedure:             Pre-Anesthesia Assessment:                        - Prior to the procedure, a History and Physical was                         performed, and patient medications and allergies were                         reviewed. The patient is competent. The risks and                         benefits of the procedure and the sedation options and                         risks were discussed with the patient. All questions                         were answered and informed consent was obtained.                         Patient identification and proposed procedure were                         verified by the physician, the nurse, the                         anesthesiologist, the anesthetist and the technician                         in the pre-procedure area in the procedure room in the                         endoscopy suite. Mental Status Examination: alert and                         oriented. Airway Examination: normal oropharyngeal  airway and neck mobility. Respiratory Examination:                         clear to auscultation. CV Examination: normal.                         Prophylactic  Antibiotics: The patient does not require                         prophylactic antibiotics. Prior Anticoagulants: The                         patient has taken no anticoagulant or antiplatelet                         agents. ASA Grade Assessment: III - A patient with                         severe systemic disease. After reviewing the risks and                         benefits, the patient was deemed in satisfactory                         condition to undergo the procedure. The anesthesia                         plan was to use general anesthesia. Immediately prior                         to administration of medications, the patient was                         re-assessed for adequacy to receive sedatives. The                         heart rate, respiratory rate, oxygen saturations,                         blood pressure, adequacy of pulmonary ventilation, and                         response to care were monitored throughout the                         procedure. The physical status of the patient was                         re-assessed after the procedure.                        After obtaining informed consent, the endoscope was                         passed under direct vision. Throughout the procedure,                         the patient's blood pressure, pulse, and oxygen  saturations were monitored continuously. The Endoscope                         was introduced through the mouth, and advanced to the                         second part of duodenum. The upper GI endoscopy was                         accomplished without difficulty. The patient tolerated                         the procedure well. Findings:      One benign-appearing, intrinsic mild stenosis was found. The stenosis       was traversed. A TTS dilator was passed through the scope. Dilation with       a 12-13.5-15 mm balloon dilator was performed to 15 mm. Estimated blood       loss was  minimal.      A medium-sized hiatal hernia was present.      Multiple dispersed diminutive erosions with no bleeding and no stigmata       of recent bleeding were found in the gastric body.      The cardia and gastric fundus were normal on retroflexion.      The duodenal bulb and second portion of the duodenum were normal. Impression:            - Benign-appearing esophageal stenosis. Dilated.                        - Medium-sized hiatal hernia.                        - Erosive gastropathy with no bleeding and no stigmata                         of recent bleeding.                        - Normal duodenal bulb and second portion of the                         duodenum.                        - No specimens collected. Recommendation:        - Discharge patient to home (with escort).                        - Resume previous diet today.                        - Continue present medications. Procedure Code(s):     --- Professional ---                        [phone], Esophagogastroduodenoscopy, flexible,                         transoral; with transendoscopic balloon dilation of  esophagus (less than 30 mm diameter) Diagnosis Code(s):     --- Professional ---                        K22.2, Esophageal obstruction                        K44.9, Diaphragmatic hernia without obstruction or                         gangrene                        K31.89, Other diseases of stomach and duodenum                        R13.14, Dysphagia, pharyngoesophageal phase CPT copyright 2022 American Medical Association. All rights reserved. The codes documented in this report are preliminary and upon coder review may  be revised to meet current compliance requirements. Dr. Corinn Brooklyn Corinn Jess Brooklyn MD, MD 04/27/2024 8:19:48 AM This report has been signed electronically. Number of Addenda: 0 Note Initiated On: 04/27/2024 7:49 AM Estimated Blood Loss:  Estimated blood loss: none.       [facility]

## 2024-04-27 NOTE — Transfer of Care (Signed)
 Immediate Anesthesia Transfer of Care Note  Patient: Tonya Murray [facility]  Procedure(s) Performed: EGD (ESOPHAGOGASTRODUODENOSCOPY) Balloon dilation wire-guided  Patient Location: Endoscopy Unit  Anesthesia Type:General  Level of Consciousness: drowsy  Airway & Oxygen Therapy: Patient Spontanous Breathing  Post-op Assessment: Report given to RN  Post vital signs: stable  Last Vitals:  Vitals Value Taken Time  BP    Temp    Pulse    Resp    SpO2      Last Pain:  Vitals:   04/27/24 0717  TempSrc: Temporal  PainSc: 0-No pain         Complications: No notable events documented.

## 2024-07-04 ENCOUNTER — Encounter: Payer: Self-pay | Admitting: Student

## 2024-07-04 ENCOUNTER — Ambulatory Visit: Admitting: Student

## 2024-07-04 VITALS — BP 126/82 | HR 67 | Temp 98.0°F | Ht 66.0 in | Wt 159.0 lb

## 2024-07-04 DIAGNOSIS — I10 Essential (primary) hypertension: Secondary | ICD-10-CM

## 2024-07-04 DIAGNOSIS — I6523 Occlusion and stenosis of bilateral carotid arteries: Secondary | ICD-10-CM

## 2024-07-04 DIAGNOSIS — R21 Rash and other nonspecific skin eruption: Secondary | ICD-10-CM | POA: Diagnosis not present

## 2024-07-04 DIAGNOSIS — E785 Hyperlipidemia, unspecified: Secondary | ICD-10-CM

## 2024-07-04 MED ORDER — LOSARTAN POTASSIUM 50 MG PO TABS: 25.0000 mg | ORAL_TABLET | Freq: Every day | ORAL | Status: AC

## 2024-07-04 NOTE — Progress Notes (Signed)
 "  Established Patient Office Visit  Subjective   Patient ID: Tonya Murray, female    DOB: 18-Jan-1956  Age: 69 y.o. MRN: [id number]  Chief Complaint  Patient presents with   Hypertension   Referral    Dermatology - Dr. Arlyss, wound on her right shoulder and rash on her back of her left ear, going on couple of months, possibly eczema     Tonya Murray is a 69 y.o. person with medical hx listed below who presents today for follow up of hypertension.   Also has a rash of the right shoulder for about 3 months ago. Under the right bra strap. Reports RN pointed it out during recent endoscopy in November. Reports it has much improve in size with castor oil. Is not sure when it started or recall any injury to the area. Notes 2 spots behind the left ear that occasionally drain clear fluid several months ago but has not healed. She is concerned about skin cancer. Does not have a dermatologist.  Denies fever, drainage, pain, pruritus, and bleeding.  Patient Active Problem List   Diagnosis Date Noted   Rash 07/04/2024   Chronic pain of right knee 08/30/2023   Encounter for screening colonoscopy    Pharyngeal dysphagia 11/25/2021   GERD without esophagitis 11/25/2021   Status post hysterectomy 07/04/2018   History of closed head injury 06/21/2018   Hyperlipidemia LDL goal <70 06/10/2018   Cystocele, midline 05/24/2018   Cerebellar atrophy 05/20/2018   Carotid atherosclerosis, bilateral 05/20/2018   Hx of completed stroke 05/20/2018   Family history of TIAs 05/11/2018   Essential hypertension 08/27/2009      ROS Refer to HPI    Objective:     Outpatient Encounter Medications as of 07/04/2024  Medication Sig   aspirin  EC 81 MG tablet Take 1 tablet (81 mg total) by mouth daily. Swallow whole.   Multiple Vitamins-Minerals (MULTIVITAMIN WITH MINERALS) tablet Take 1 tablet by mouth daily.   omeprazole  (PRILOSEC) 40 MG capsule Take 1 capsule (40 mg total) by mouth 2 (two)  times daily before a meal.   [DISCONTINUED] losartan  (COZAAR ) 50 MG tablet Take 1 tablet (50 mg total) by mouth daily.   losartan  (COZAAR ) 50 MG tablet Take 0.5 tablets (25 mg total) by mouth daily.   No facility-administered encounter medications on file as of 07/04/2024.    BP 126/82   Pulse 67   Temp 98 F (36.7 C) (Oral)   Ht 5' 6 (1.676 m)   Wt 159 lb (72.1 kg)   SpO2 97%   BMI 25.66 kg/m  BP Readings from Last 3 Encounters:  07/04/24 126/82  04/27/24 130/69  03/21/24 133/79    Physical Exam Constitutional:      Appearance: Normal appearance.  HENT:     Mouth/Throat:     Mouth: Mucous membranes are moist.     Pharynx: Oropharynx is clear.  Cardiovascular:     Rate and Rhythm: Normal rate and regular rhythm.  Pulmonary:     Effort: Pulmonary effort is normal.     Breath sounds: No rhonchi or rales.  Abdominal:     General: Abdomen is flat. Bowel sounds are normal. There is no distension.     Palpations: Abdomen is soft.     Tenderness: There is no abdominal tenderness.  Musculoskeletal:        General: Normal range of motion.     Right lower leg: No edema.     Left  lower leg: No edema.  Skin:    Capillary Refill: Capillary refill takes less than 2 seconds.     Comments: Erythematous patch of the R shoulder with mild scaling, erythematous macules behind the Left ear with scaling  Neurological:     General: No focal deficit present.     Mental Status: She is alert and oriented to person, place, and time.  Psychiatric:        Mood and Affect: Mood normal.        Behavior: Behavior normal.           07/04/2024   11:14 AM 02/15/2024    9:34 AM 01/12/2024    4:24 PM  Depression screen PHQ 2/9  Decreased Interest 0 0 0  Down, Depressed, Hopeless 0 0 0  PHQ - 2 Score 0 0 0  Altered sleeping  0 0  Tired, decreased energy  1 1  Change in appetite  0 0  Feeling bad or failure about yourself   0 0  Trouble concentrating  0 0  Moving slowly or fidgety/restless   0 0  Suicidal thoughts  0 0  PHQ-9 Score  1  1   Difficult doing work/chores  Not difficult at all Not difficult at all     Data saved with a previous flowsheet row definition       07/04/2024   11:14 AM 02/15/2024    9:35 AM 12/09/2023   10:45 AM 08/30/2023    8:26 AM  GAD 7 : Generalized Anxiety Score  Nervous, Anxious, on Edge 0 1  1  0   Control/stop worrying 0 1  1  0   Worry too much - different things  1  1  0   Trouble relaxing  0  0  0   Restless  0  0  0   Easily annoyed or irritable  0  0  0   Afraid - awful might happen  0  0  0   Total GAD 7 Score  3 3 0  Anxiety Difficulty  Not difficult at all Not difficult at all Not difficult at all     Data saved with a previous flowsheet row definition    No results found for any visits on 07/04/24.  Last CBC Lab Results  Component Value Date   WBC 6.4 08/30/2023   HGB 11.9 (L) 08/30/2023   HCT 36.2 08/30/2023   MCV 89.2 08/30/2023   MCH 29.3 08/30/2023   RDW 13.2 08/30/2023   PLT 329 08/30/2023   Last metabolic panel Lab Results  Component Value Date   GLUCOSE 89 08/30/2023   NA 137 08/30/2023   K 4.2 08/30/2023   CL 105 08/30/2023   CO2 23 08/30/2023   BUN 20 08/30/2023   CREATININE 0.85 08/30/2023   GFRNONAA >60 08/30/2023   CALCIUM  9.2 08/30/2023   PROT 7.2 08/30/2023   ALBUMIN 3.9 08/30/2023   LABGLOB 2.8 10/13/2022   AGRATIO 1.5 10/13/2022   BILITOT 0.6 08/30/2023   ALKPHOS 74 08/30/2023   AST 21 08/30/2023   ALT 15 08/30/2023   ANIONGAP 9 08/30/2023   Last lipids Lab Results  Component Value Date   CHOL 246 (H) 08/30/2023   HDL 55 08/30/2023   LDLCALC 154 (H) 08/30/2023   TRIG 183 (H) 08/30/2023   CHOLHDL 4.5 08/30/2023   Last hemoglobin A1c No results found for: HGBA1C    The 10-year ASCVD risk score (Arnett DK, et al., 2019) is:  10.7%    Assessment & Plan:  Hyperlipidemia LDL goal <70 Assessment & Plan: Previously on a ?pravastatin 2-3 year ago, had significant muscles and  allergic hives. Elevated LDL >70 in 08/2023. Does have hx of CVA and b/l carotid atherosclerosis discussed risks and benefits of lowering cholesterol. She would like to hold of on non statin medication. Will repeat lipid panel at next visit.    Essential hypertension Assessment & Plan: Has been cutting losartan  in half a tablet due to low pressure around 6 months Denies dizziness or weakness.  Normotensive today. Continue losartan  25 mg daily.    Orders: -     Losartan  Potassium; Take 0.5 tablets (25 mg total) by mouth daily.  Carotid atherosclerosis, bilateral Assessment & Plan: Repeat carotid US  due. Will make recommendations based on results. Continue ASA  Orders: -     US  Carotid Bilateral; Future  Rash Assessment & Plan: R should lesion is irregular, eurythermal, and scaling, suspect AK regarding rashes behind the ear. Referral to dermatology   Orders: -     Ambulatory referral to Dermatology     Return in about 3 months (around 10/02/2024) for physical.    Harlene Saddler, MD "

## 2024-07-04 NOTE — Assessment & Plan Note (Addendum)
 Has been cutting losartan  in half a tablet due to low pressure around 6 months Denies dizziness or weakness.  Normotensive today. Continue losartan  25 mg daily.

## 2024-07-04 NOTE — Assessment & Plan Note (Signed)
 R should lesion is irregular, eurythermal, and scaling, suspect AK regarding rashes behind the ear. Referral to dermatology

## 2024-07-04 NOTE — Assessment & Plan Note (Addendum)
 Previously on a ?pravastatin 2-3 year ago, had significant muscles and allergic hives. Elevated LDL >70 in 08/2023. Does have hx of CVA and b/l carotid atherosclerosis discussed risks and benefits of lowering cholesterol. She would like to hold of on non statin medication. Will repeat lipid panel at next visit.

## 2024-07-04 NOTE — Patient Instructions (Signed)
 It was a pleasure meeting you today:  Please keep a blood pressure log ~3x a week and bring this to your next appointment  I have made referral to Dr. Arlyss regarding the rash on your shoulder  Please let me know if you find out what statin medications you have tried in the past  We will check labs next visit please fast before you next appointment

## 2024-07-04 NOTE — Assessment & Plan Note (Signed)
 Repeat carotid US  due. Will make recommendations based on results. Continue ASA

## 2024-07-11 ENCOUNTER — Ambulatory Visit

## 2024-07-11 ENCOUNTER — Telehealth: Payer: Self-pay | Admitting: Student

## 2024-07-11 NOTE — Telephone Encounter (Signed)
 Please review.  KP

## 2024-07-19 ENCOUNTER — Ambulatory Visit

## 2024-08-22 ENCOUNTER — Encounter: Admitting: Student

## 2024-10-04 ENCOUNTER — Encounter: Admitting: Student

## 2025-01-17 ENCOUNTER — Ambulatory Visit
